# Patient Record
Sex: Male | Born: 1945 | Race: White | Hispanic: No | Marital: Married | Smoking: Never smoker
Health system: Southern US, Community
[De-identification: ages and names within clinical notes are randomized; demographics above are authoritative.]

## PROBLEM LIST (undated history)

## (undated) DIAGNOSIS — I480 Paroxysmal atrial fibrillation: Secondary | ICD-10-CM

## (undated) DIAGNOSIS — Z9289 Personal history of other medical treatment: Secondary | ICD-10-CM

## (undated) DIAGNOSIS — I10 Essential (primary) hypertension: Secondary | ICD-10-CM

## (undated) DIAGNOSIS — I495 Sick sinus syndrome: Secondary | ICD-10-CM

## (undated) HISTORY — PX: KNEE SURGERY: SHX244

## (undated) HISTORY — DX: Essential (primary) hypertension: I10

## (undated) HISTORY — DX: Sick sinus syndrome: I49.5

## (undated) HISTORY — PX: HERNIA REPAIR: SHX51

## (undated) HISTORY — DX: Personal history of other medical treatment: Z92.89

---

## 1998-07-18 ENCOUNTER — Ambulatory Visit (HOSPITAL_COMMUNITY): Admission: RE | Admit: 1998-07-18 | Discharge: 1998-07-18 | Payer: Self-pay | Admitting: Gastroenterology

## 2009-07-11 ENCOUNTER — Emergency Department (HOSPITAL_COMMUNITY): Admission: EM | Admit: 2009-07-11 | Discharge: 2009-07-11 | Payer: Self-pay | Admitting: Emergency Medicine

## 2010-07-11 ENCOUNTER — Ambulatory Visit
Admission: RE | Admit: 2010-07-11 | Discharge: 2010-07-11 | Disposition: A | Discharge status: Home / Self Care | Payer: BC Managed Care – PPO | Source: Ambulatory Visit | Attending: Internal Medicine | Admitting: Internal Medicine

## 2010-07-11 ENCOUNTER — Other Ambulatory Visit: Payer: Self-pay | Admitting: Internal Medicine

## 2010-07-11 DIAGNOSIS — R059 Cough, unspecified: Secondary | ICD-10-CM

## 2010-07-11 DIAGNOSIS — R05 Cough: Secondary | ICD-10-CM

## 2010-07-15 LAB — POCT CARDIAC MARKERS
CKMB, poc: <1 ng/mL — ABNORMAL LOW (ref 1.0–8.0)
Myoglobin, poc: 39.2 ng/mL (ref 12–200)
Myoglobin, poc: 59.2 ng/mL (ref 12–200)

## 2010-07-15 LAB — CBC
HCT: 42.3 % (ref 39.0–52.0)
Hemoglobin: 14.4 g/dL (ref 13.0–17.0)
MCHC: 34.2 g/dL (ref 30.0–36.0)
MCV: 94 fL (ref 78.0–100.0)
Platelets: 143 10*3/uL — ABNORMAL LOW (ref 150–400)

## 2010-07-15 LAB — DIFFERENTIAL
Basophils Absolute: 0 10*3/uL (ref 0.0–0.1)
Lymphs Abs: 2.2 10*3/uL (ref 0.7–4.0)
Monocytes Relative: 7 % (ref 3–12)
Neutrophils Relative %: 62 % (ref 43–77)

## 2010-07-15 LAB — POCT I-STAT, CHEM 8
Calcium, Ion: 1.08 mmol/L — ABNORMAL LOW (ref 1.12–1.32)
Creatinine, Ser: 1 mg/dL (ref 0.4–1.5)
Potassium: 3.5 mEq/L (ref 3.5–5.1)
TCO2: 29 mmol/L (ref 0–100)

## 2010-07-15 LAB — PROTIME-INR
INR: 1.01 (ref 0.00–1.49)
Prothrombin Time: 13.2 seconds (ref 11.6–15.2)

## 2010-12-09 ENCOUNTER — Emergency Department (HOSPITAL_COMMUNITY)
Admission: EM | Admit: 2010-12-09 | Discharge: 2010-12-10 | Disposition: A | Discharge status: Home / Self Care | Payer: BC Managed Care – PPO | Attending: Emergency Medicine | Admitting: Emergency Medicine

## 2010-12-09 DIAGNOSIS — J329 Chronic sinusitis, unspecified: Secondary | ICD-10-CM | POA: Insufficient documentation

## 2010-12-09 DIAGNOSIS — I10 Essential (primary) hypertension: Secondary | ICD-10-CM | POA: Insufficient documentation

## 2010-12-09 DIAGNOSIS — R51 Headache: Secondary | ICD-10-CM | POA: Insufficient documentation

## 2011-05-26 ENCOUNTER — Ambulatory Visit (INDEPENDENT_AMBULATORY_CARE_PROVIDER_SITE_OTHER): Payer: BC Managed Care – PPO | Admitting: Family Medicine

## 2011-05-26 VITALS — BP 146/77 | HR 63 | Temp 97.9°F | Resp 16 | Ht 68.0 in | Wt 203.8 lb

## 2011-05-26 DIAGNOSIS — J019 Acute sinusitis, unspecified: Secondary | ICD-10-CM

## 2011-05-26 DIAGNOSIS — I10 Essential (primary) hypertension: Secondary | ICD-10-CM

## 2011-05-26 MED ORDER — CEFDINIR 300 MG PO CAPS: 300.0000 mg | ORAL_CAPSULE | Freq: Two times a day (BID) | ORAL | Status: AC

## 2011-05-26 NOTE — Progress Notes (Signed)
  Subjective:    Patient ID: Nathan Riley, male    DOB: 01/09/1946, 66 y.o.   MRN: [id number]  HPI Nathan Riley is a 66 y.o. male Head cold x 2-3 weeks, improved past 4-5 days.  But now with L sinus pressure and headache, and L upper tooth pain x 2-3 days.  HA, min relief with tylenol.   Review of Systems  Constitutional: Negative for fever, chills and diaphoresis.  HENT: Positive for nosebleeds, congestion, facial swelling, dental problem, postnasal drip and sinus pressure. Negative for ear pain, rhinorrhea, neck pain and ear discharge.        Swelling l cheek/sinus   Respiratory: Positive for chest tightness. Negative for cough and shortness of breath.   Cardiovascular: Negative for chest pain and palpitations.  Skin: Negative for rash.  Neurological: Positive for dizziness.       Dizziness last week with congestion       Objective:   Physical Exam  Constitutional: He is oriented to person, place, and time. He appears well-developed and well-nourished. No distress.  HENT:  Head: Normocephalic and atraumatic.  Right Ear: Tympanic membrane, external ear and ear canal normal. No middle ear effusion.  Left Ear: Tympanic membrane, external ear and ear canal normal.  No middle ear effusion.  Nose: Right sinus exhibits no maxillary sinus tenderness and no frontal sinus tenderness. Left sinus exhibits maxillary sinus tenderness. Left sinus exhibits no frontal sinus tenderness.  Mouth/Throat: Uvula is midline, oropharynx is clear and moist and mucous membranes are normal. No oral lesions. No dental abscesses or dental caries.  Cardiovascular: Normal rate, regular rhythm, normal heart sounds and intact distal pulses.   Pulmonary/Chest: Effort normal and breath sounds normal. He has no wheezes. He has no rales.  Musculoskeletal: Normal range of motion.  Neurological: He is alert and oriented to person, place, and time.  Skin: Skin is warm and dry. He is not diaphoretic.  Psychiatric: He  has a normal mood and affect.    .      Assessment & Plan:   1. Sinusitis acute   2. HTN (hypertension)     omnicef 300mg  bid saline ns, avoid decongestants with HTN,  rtc precautions

## 2011-05-26 NOTE — Patient Instructions (Signed)
  Return to the clinic or go to the nearest emergency room if any of your symptoms worsen or new symptoms occur.   Sinusitis Sinuses are air pockets within the bones of your face. The growth of bacteria within a sinus leads to infection. The infection prevents the sinuses from draining. This infection is called sinusitis. SYMPTOMS  There will be different areas of pain depending on which sinuses have become infected.  The maxillary sinuses often produce pain beneath the eyes.   Frontal sinusitis may cause pain in the middle of the forehead and above the eyes.  Other problems (symptoms) include:  Toothaches.   Colored, pus-like (purulent) drainage from the nose.   Swelling, warmth, and tenderness over the sinus areas may be signs of infection.  TREATMENT  Sinusitis is most often determined by an exam.X-rays may be taken. If x-rays have been taken, make sure you obtain your results or find out how you are to obtain them. Your caregiver may give you medications (antibiotics). These are medications that will help kill the bacteria causing the infection. You may also be given a medication (decongestant) that helps to reduce sinus swelling.  HOME CARE INSTRUCTIONS   Only take over-the-counter or prescription medicines for pain, discomfort, or fever as directed by your caregiver.   Drink extra fluids. Fluids help thin the mucus so your sinuses can drain more easily.   Applying either moist heat or ice packs to the sinus areas may help relieve discomfort.  Use saline nasal sprays to help moisten your sinuses. The sprays can be found at your local drugstore.  SEEK IMMEDIATE MEDICAL CARE IF:  You have a fever.   You have increasing pain, severe headaches, or toothache.   You have nausea, vomiting, or drowsiness.   You develop unusual swelling around the face or trouble seeing.  MAKE SURE YOU:   Understand these instructions.   Will watch your condition.   Will get help right away if  you are not doing well or get worse.  Document Released: 04/07/2005 Document Revised: 12/18/2010 Document Reviewed: 11/04/2006 [facility] Patient Information 2012 [place], [place].

## 2011-07-18 IMAGING — CT CT HEAD W/O CM
1 of 2 series · 13 of 30 positions shown, 17 images · non-contrast
Comparison: None.

CLINICAL DATA: Headaches with lightheadedness and nausea.

CT HEAD WITHOUT CONTRAST
TECHNIQUE: Contiguous axial images were obtained from the base of
the skull through the vertex without contrast.

[Series 2: brain · axial · 0.47mm/px · z∈[+153,+290]mm · 13 of 32 slices shown, 17 images]
[im 3/32  brain]
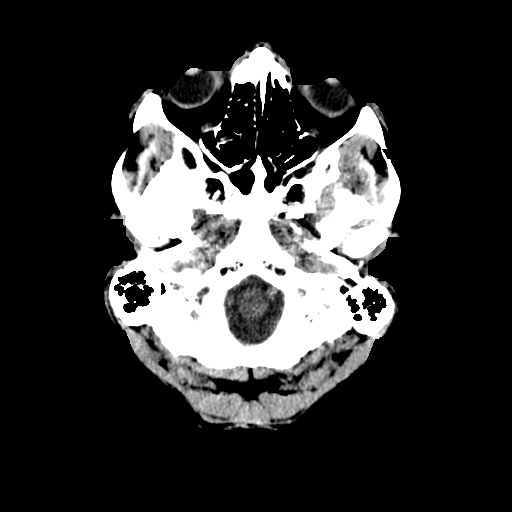
[im 3/32  bone]
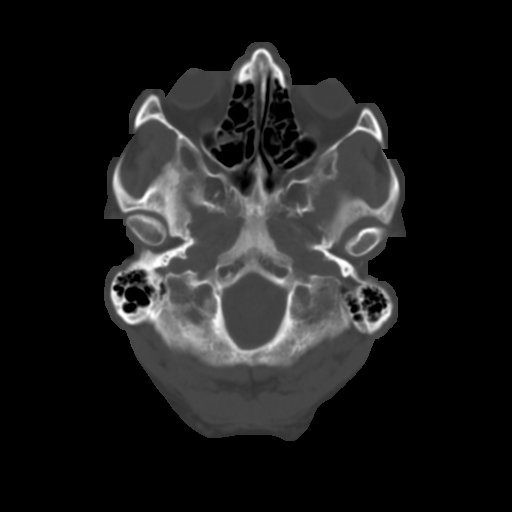
[im 5/32  brain]
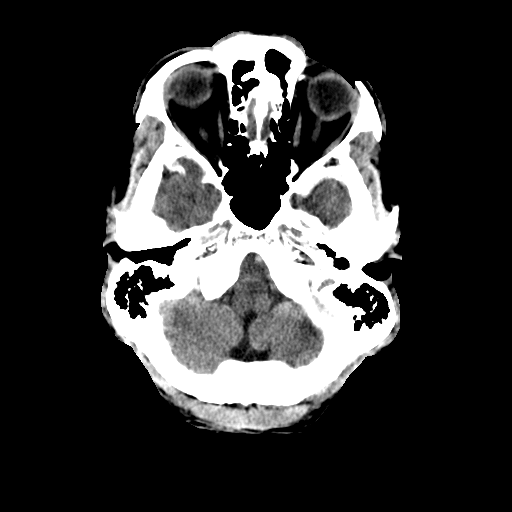
[im 7/32  brain]
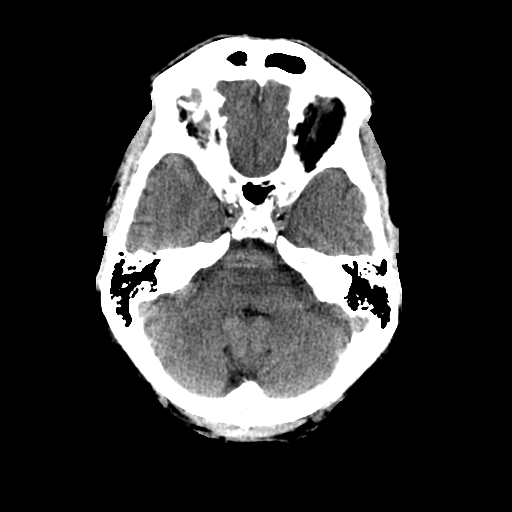
[im 9/32  brain]
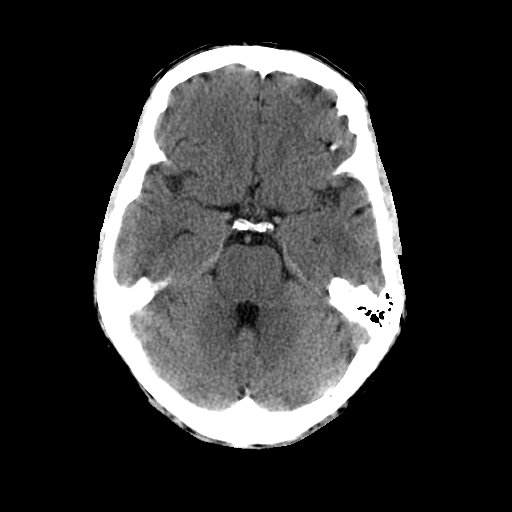
[im 12/32  brain]
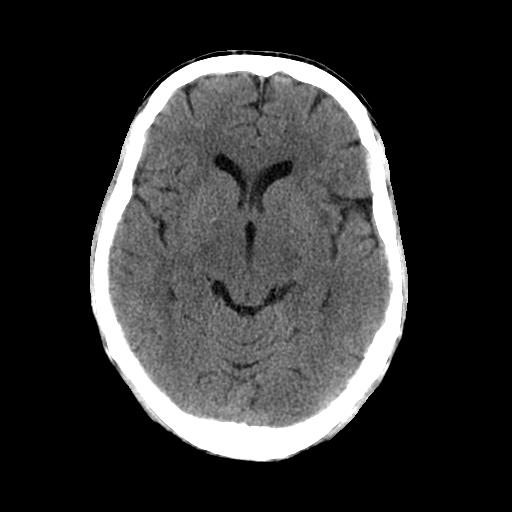
[im 12/32  bone]
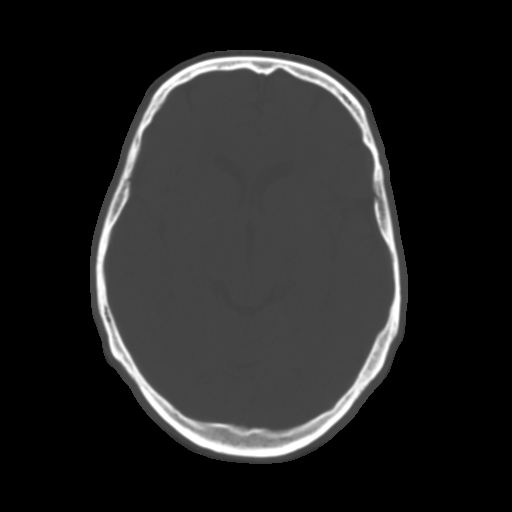
[im 14/32  brain]
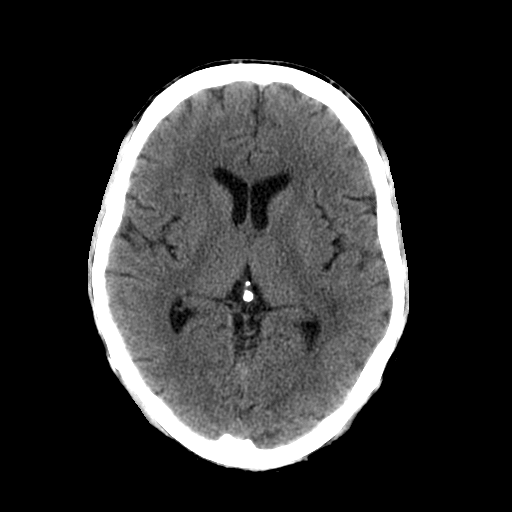
[im 16/32  brain]
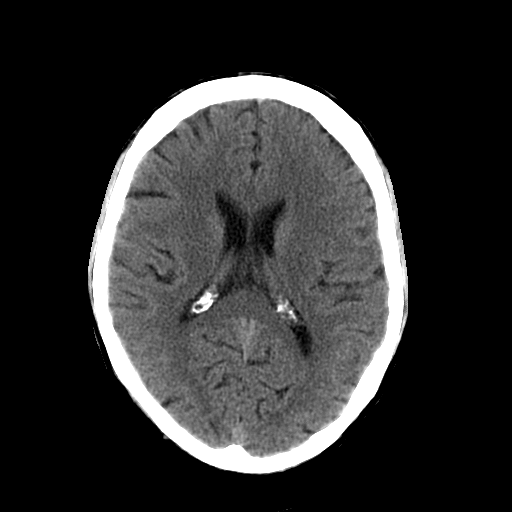
[im 18/32  brain]
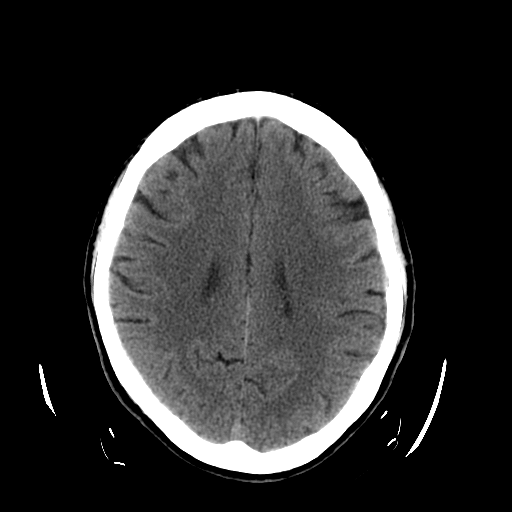
[im 20/32  brain]
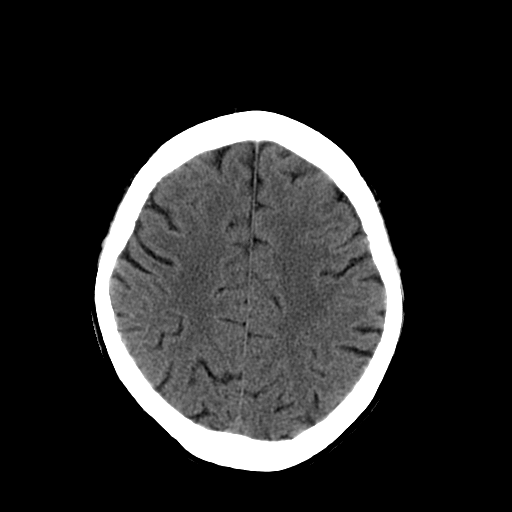
[im 20/32  bone]
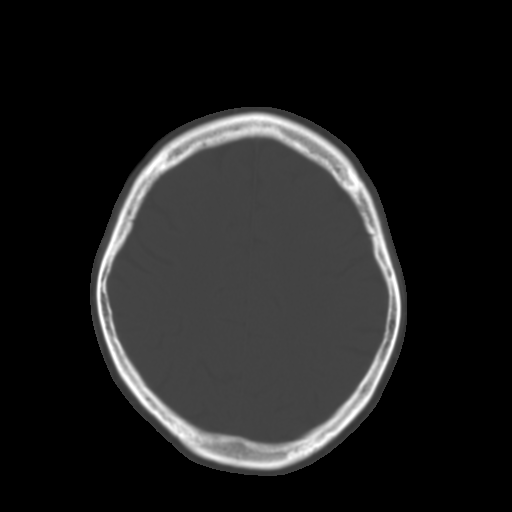
[im 23/32  brain]
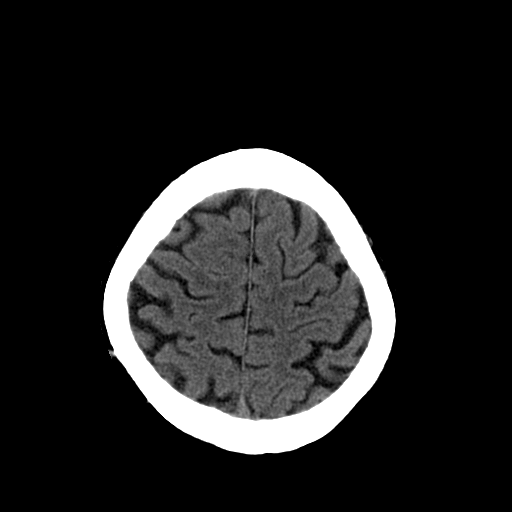
[im 25/32  brain]
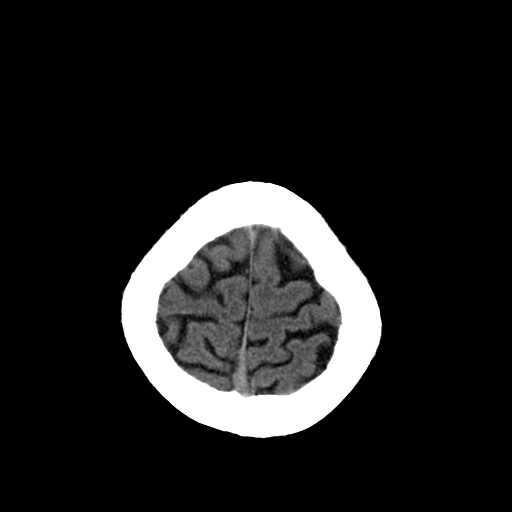
[im 27/32  brain]
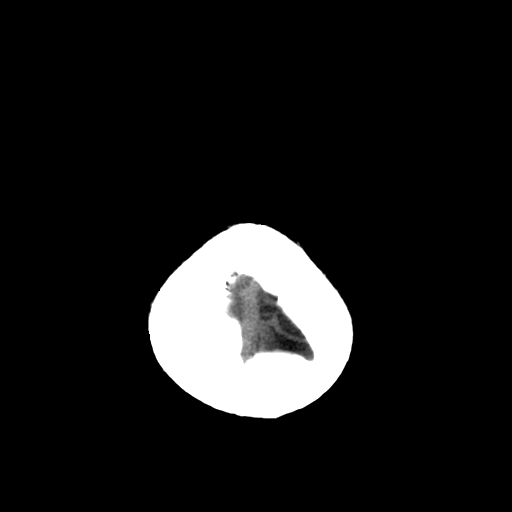
[im 29/32  brain]
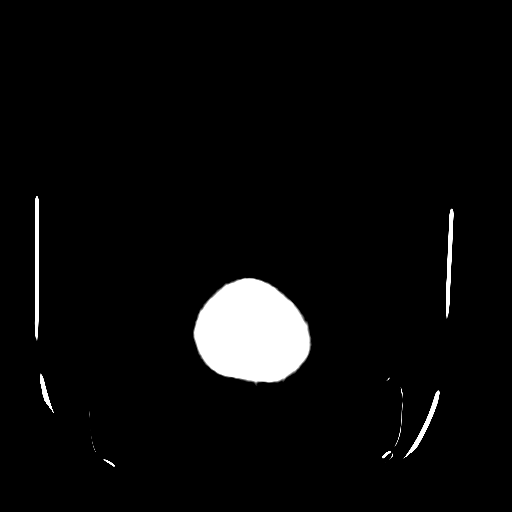
[im 29/32  bone]
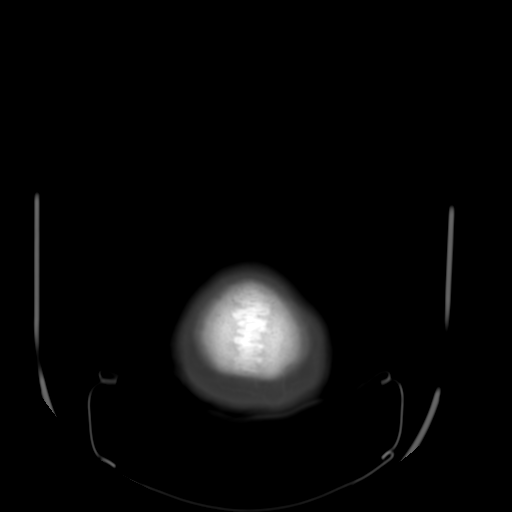

[13 of 30 positions shown; findings below may reference images not displayed]

FINDINGS: There is no evidence of acute intracranial hemorrhage,
mass lesion, brain edema or extra-axial fluid collection.  The
ventricles and subarachnoid spaces are appropriately sized for age.
There is no CT evidence of acute cortical infarction.

The visualized paranasal sinuses are clear. The calvarium is
intact.
IMPRESSION: Unremarkable noncontrast head CT for age.

## 2011-07-18 IMAGING — CR DG CHEST 2V
2 series · 2 of 2 positions shown · non-contrast
Comparison: None.

CLINICAL DATA: Dizziness, diaphoresis and indigestion.

CHEST - 2 VIEW

[w chest pa]
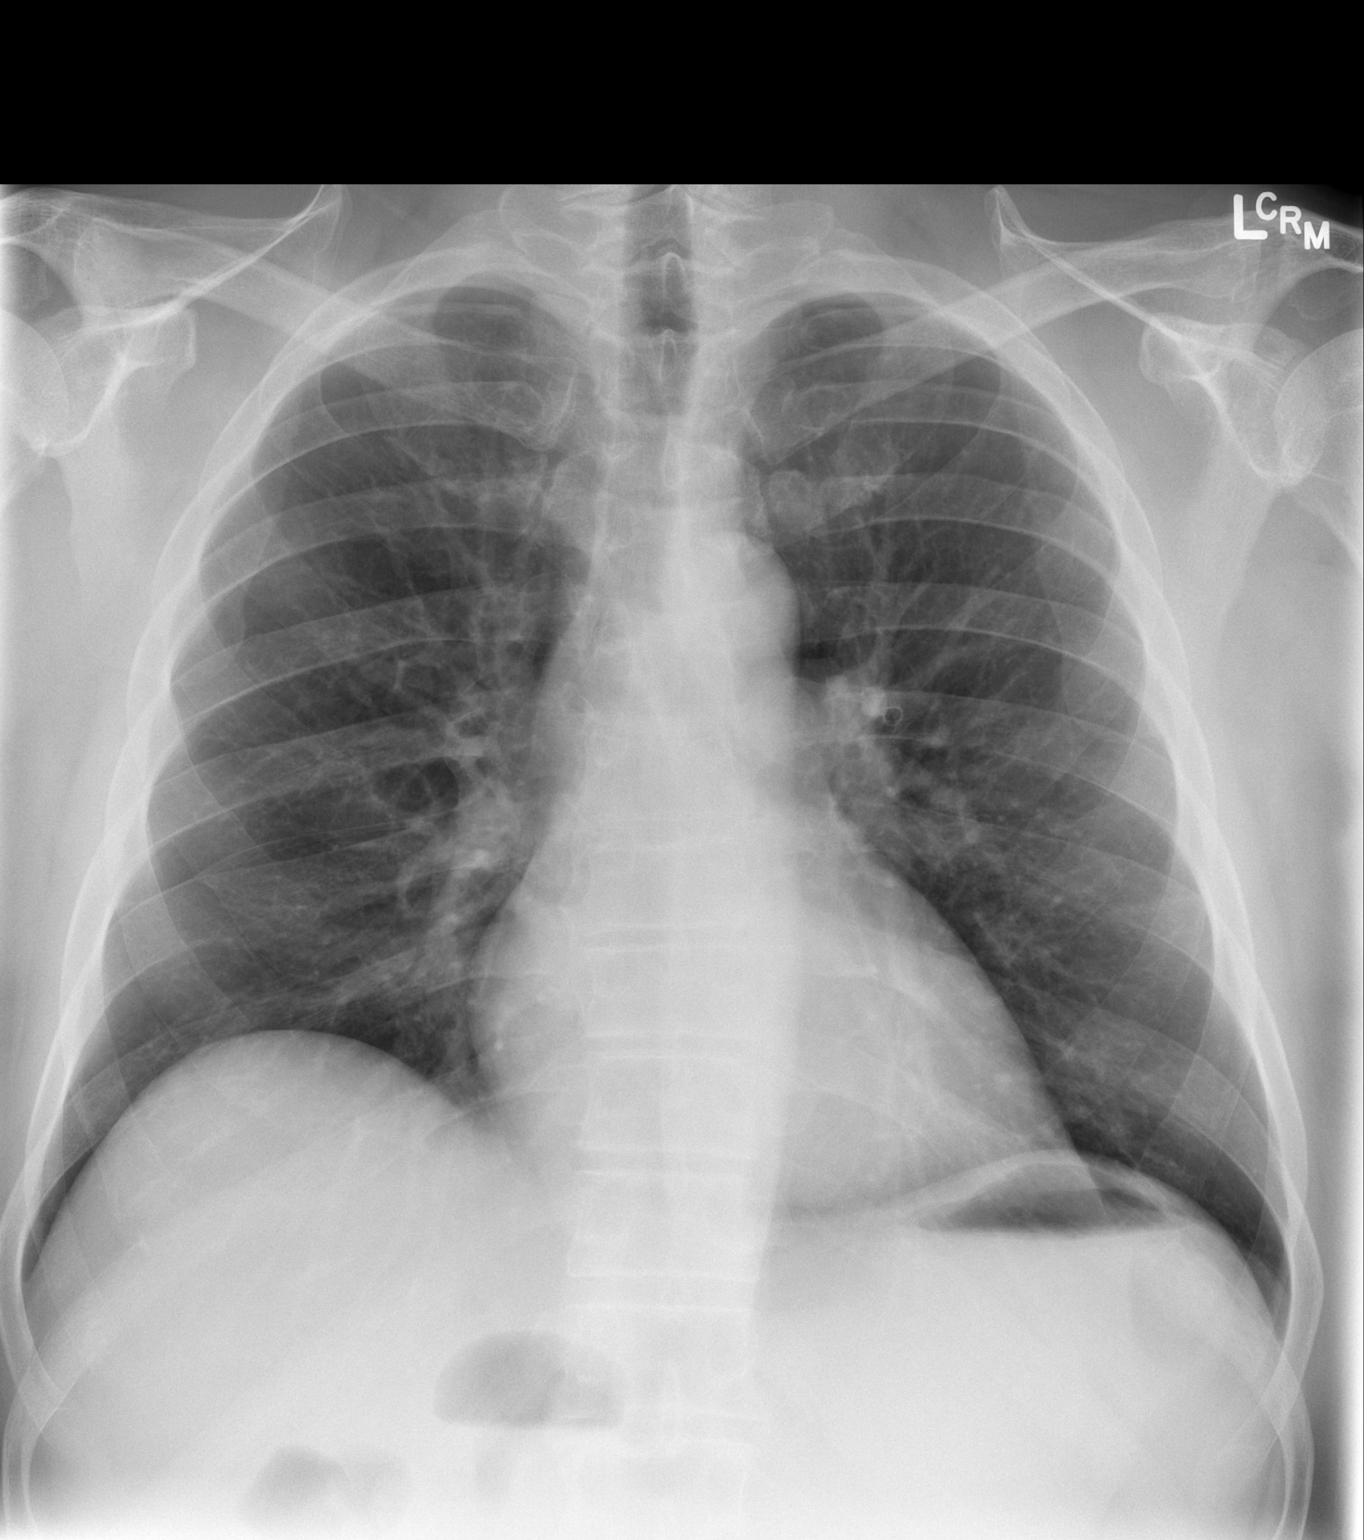

[w chest lat]
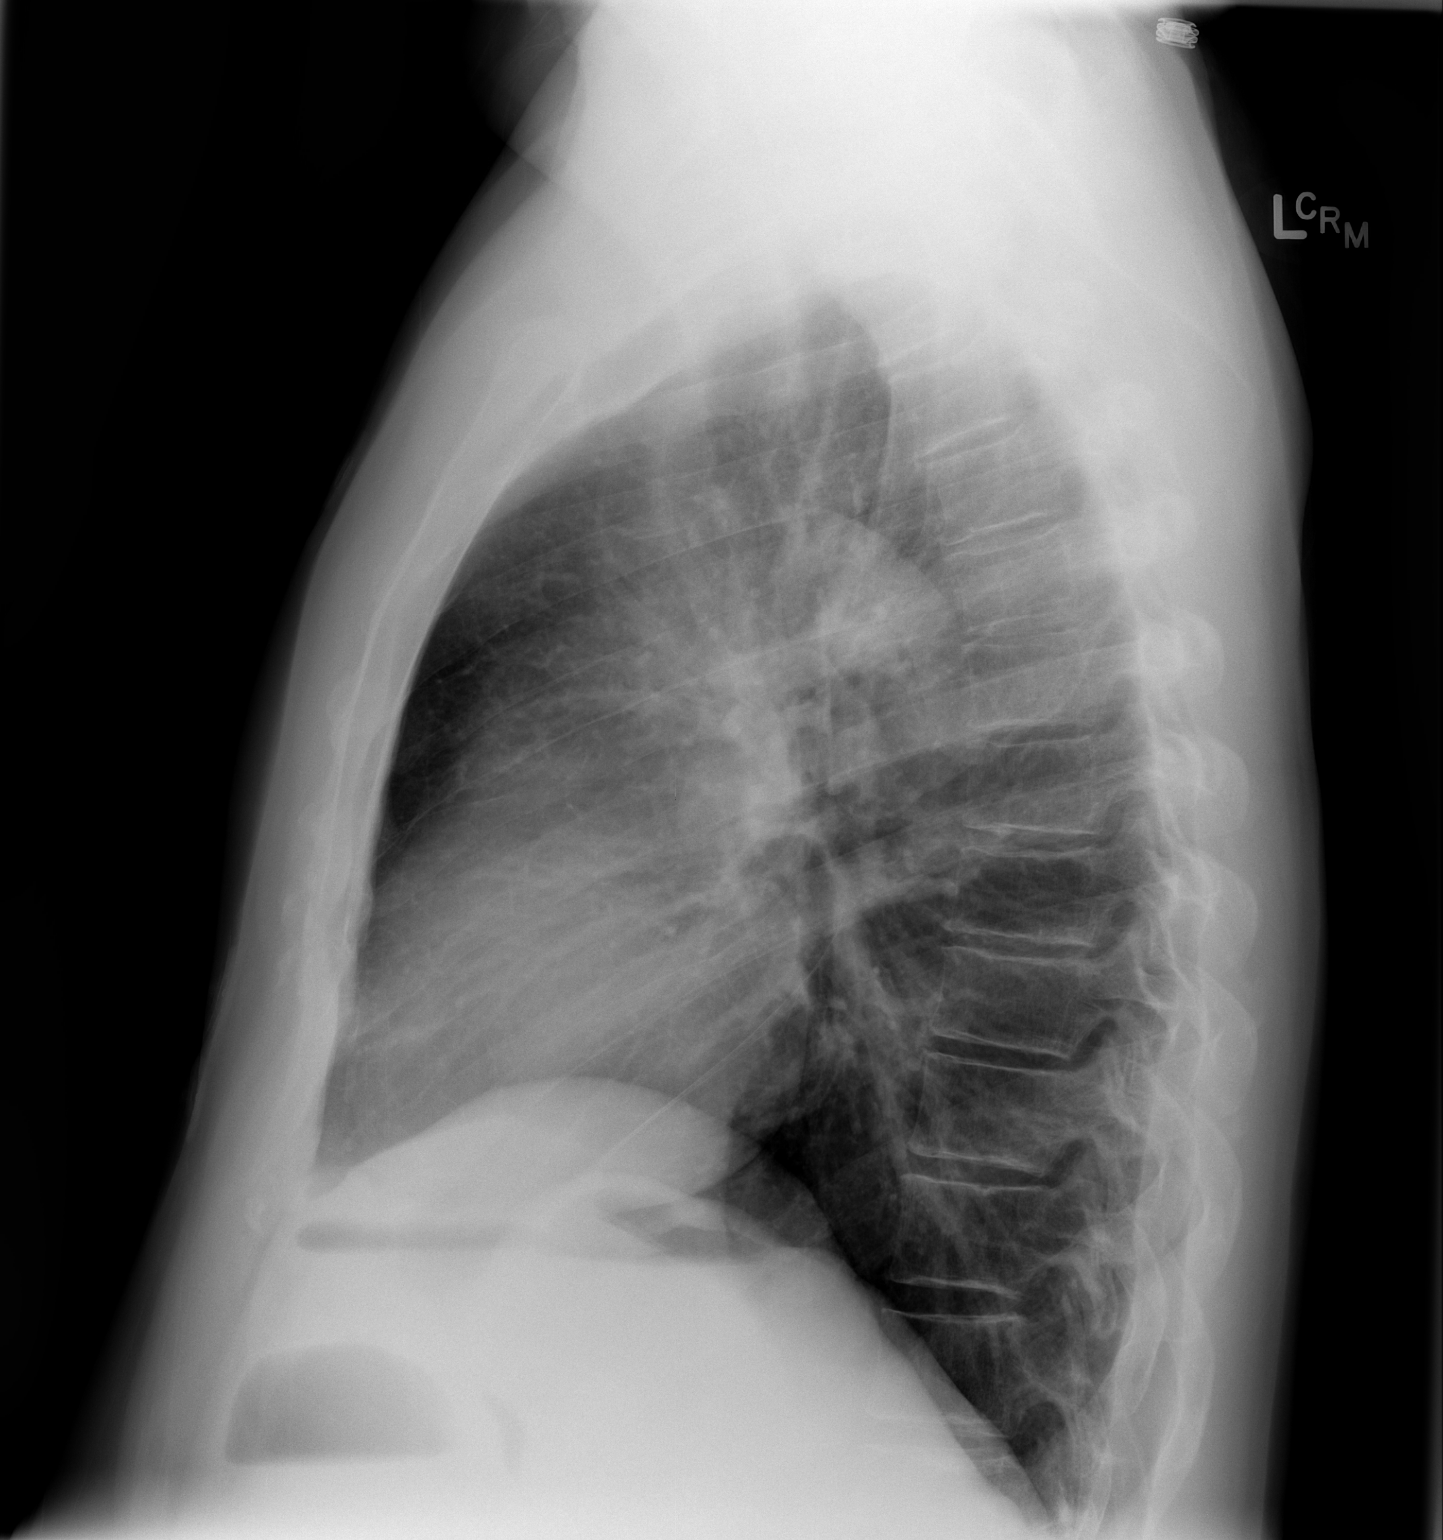

[2 of 2 positions shown; findings below may reference images not displayed]

FINDINGS: The heart size and mediastinal contours are normal.  The
lungs are clear.  There is no pleural effusion or pneumothorax.
The osseous structures appear normal.
IMPRESSION: No active cardiopulmonary process.

## 2012-01-06 ENCOUNTER — Other Ambulatory Visit: Payer: Self-pay | Admitting: Gastroenterology

## 2013-06-08 DIAGNOSIS — H2510 Age-related nuclear cataract, unspecified eye: Secondary | ICD-10-CM | POA: Insufficient documentation

## 2013-06-08 DIAGNOSIS — H3530 Unspecified macular degeneration: Secondary | ICD-10-CM | POA: Insufficient documentation

## 2013-06-08 DIAGNOSIS — H01009 Unspecified blepharitis unspecified eye, unspecified eyelid: Secondary | ICD-10-CM | POA: Insufficient documentation

## 2013-07-13 DIAGNOSIS — Z961 Presence of intraocular lens: Secondary | ICD-10-CM | POA: Insufficient documentation

## 2013-08-01 ENCOUNTER — Encounter: Payer: Self-pay | Admitting: *Deleted

## 2013-08-04 DIAGNOSIS — H35359 Cystoid macular degeneration, unspecified eye: Secondary | ICD-10-CM | POA: Insufficient documentation

## 2013-08-04 DIAGNOSIS — H35719 Central serous chorioretinopathy, unspecified eye: Secondary | ICD-10-CM | POA: Insufficient documentation

## 2013-08-30 ENCOUNTER — Other Ambulatory Visit: Payer: Self-pay | Admitting: Dermatology

## 2013-09-08 DIAGNOSIS — IMO0002 Reserved for concepts with insufficient information to code with codable children: Secondary | ICD-10-CM | POA: Insufficient documentation

## 2014-03-28 ENCOUNTER — Telehealth: Payer: Self-pay | Admitting: Interventional Cardiology

## 2014-03-28 NOTE — Telephone Encounter (Signed)
F/u    Pt's wife waiting on call back from nurse to see if pt can be seen today. Please call.

## 2014-03-28 NOTE — Telephone Encounter (Signed)
New message        Pt would like to see dr Katrinka Blazingsmith today / pt pulse was 138 , bp was 118

## 2014-03-28 NOTE — Telephone Encounter (Signed)
Returned pt wife call.she reports that pt heartrate this morning at the dentist was 138 bpm.pt had a near syncope episode last week. Pt pcp recommended pt f/ u with pcp.appt scheduled with Dr.Smith on 12/10 @ 4:15pm.pt is currently asymptomatic. Pt wife adv to call back if symptoms develop.pt wife agreeable and verbalized understanding.

## 2014-03-30 ENCOUNTER — Ambulatory Visit (INDEPENDENT_AMBULATORY_CARE_PROVIDER_SITE_OTHER): Payer: Medicare HMO | Admitting: Interventional Cardiology

## 2014-03-30 ENCOUNTER — Encounter: Payer: Self-pay | Admitting: Interventional Cardiology

## 2014-03-30 VITALS — BP 144/72 | HR 51 | Ht 68.0 in | Wt 198.8 lb

## 2014-03-30 DIAGNOSIS — R55 Syncope and collapse: Secondary | ICD-10-CM

## 2014-03-30 DIAGNOSIS — I10 Essential (primary) hypertension: Secondary | ICD-10-CM

## 2014-03-30 DIAGNOSIS — R002 Palpitations: Secondary | ICD-10-CM

## 2014-03-30 DIAGNOSIS — I493 Ventricular premature depolarization: Secondary | ICD-10-CM | POA: Insufficient documentation

## 2014-03-30 NOTE — Progress Notes (Signed)
Patient ID: Nathan Riley Impastato, male   DOB: 04-Jul-1945, 68 y.o.   MRN: [id number]    [address] [place], [place]  [id number] Phone: [phone] Fax:  [phone]  Date:  03/30/2014   ID:  Nathan Riley Edmundson, DOB 04-Jul-1945, MRN [id number]  PCP:  No primary care provider on file.   ASSESSMENT:  1. Near syncope with associated palpitations 2. History of PVCs 3. Essential hypertension  PLAN:  1. Thirty-day continuous monitor 2. If prolonged palpitations come in for evaluation but do not drive.   SUBJECTIVE: Nathan Riley Trinka is a 68 y.o. male who is concerned about 2 or 3 episodes of near syncope. One episode occurred while driving in the presence of his wife. He states that he had palpitations and felt as though he maced gray out. It quickly resolved after no longer than 3-4 seconds. He has had other similar episodes over the past month. He also knows that oh one occasion he had a very rapid heartbeat detected by his blood pressure monitor that suggested a heart rate greater than 138 bpm. Otherwise he is doing well. No physical limitations. Denies dyspnea on exertion. He has not had chest pain. No orthopnea or PND. No transient neurological complaints.   Wt Readings from Last 3 Encounters:  03/30/14 198 lb 12.8 oz (90.175 kg)  05/26/11 203 lb 12.8 oz (92.443 kg)     Past Medical History  Diagnosis Date  . Essential hypertension, benign   . Palpitations   . Other premature beats     Current Outpatient Prescriptions  Medication Sig Dispense Refill  . Cholecalciferol (PA VITAMIN D-3) 2000 UNITS CAPS daily.    . fish oil-omega-3 fatty acids 1000 MG capsule Take 2 g by mouth daily.    . Melatonin 5 MG CAPS Take 1 capsule by mouth at bedtime.    . metoprolol tartrate (LOPRESSOR) 25 MG tablet Take 12.5 mg by mouth 2 (two) times daily.   3   No current facility-administered medications for this visit.    Allergies:   No Known Allergies  Social History:  The patient   reports that he has never smoked. He does not have any smokeless tobacco history on file.   ROS:  Please see the history of present illness.   No wheezing, double vision, nocturia, polyuria, headache, or edema.   All other systems reviewed and negative.   OBJECTIVE: VS:  BP 144/72 mmHg  Pulse 51  Ht 5\' 8"  (1.727 m)  Wt 198 lb 12.8 oz (90.175 kg)  BMI 30.23 kg/m2 Well nourished, well developed, in no acute distress, healthy appearing HEENT: normal Neck: JVD flat. Carotid bruit absent  Cardiac:  normal S1, S2; RRR; no murmur Lungs:  clear to auscultation bilaterally, no wheezing, rhonchi or rales Abd: soft, nontender, no hepatomegaly Ext: Edema absent. Pulses 2+ Skin: warm and dry Neuro:  CNs 2-12 intact, no focal abnormalities noted  EKG:  Sinus bradycardia at 51 bpm with small inferior Q waves       Signed, Darci NeedleHenry W. Riley. Janal Haak III, MD 03/30/2014 5:37 PM

## 2014-03-30 NOTE — Patient Instructions (Signed)
Your physician recommends that you continue on your current medications as directed. Please refer to the Current Medication list given to you today.  Your physician has recommended that you wear an event monitor. Event monitors are medical devices that record the heart's electrical activity. Doctors most often us these monitors to diagnose arrhythmias. Arrhythmias are problems with the speed or rhythm of the heartbeat. The monitor is a small, portable device. You can wear one while you do your normal daily activities. This is usually used to diagnose what is causing palpitations/syncope (passing out).  Your physician recommends that you schedule a follow-up appointment pending results

## 2014-03-31 ENCOUNTER — Encounter: Payer: Self-pay | Admitting: Radiology

## 2014-03-31 ENCOUNTER — Encounter (INDEPENDENT_AMBULATORY_CARE_PROVIDER_SITE_OTHER): Payer: Medicare HMO

## 2014-03-31 DIAGNOSIS — R55 Syncope and collapse: Secondary | ICD-10-CM

## 2014-03-31 DIAGNOSIS — R002 Palpitations: Secondary | ICD-10-CM

## 2014-03-31 NOTE — Progress Notes (Signed)
[employer] 30 day monitor applied. EOS 05-01-14

## 2014-04-02 ENCOUNTER — Emergency Department (HOSPITAL_COMMUNITY): Payer: Medicare HMO

## 2014-04-02 ENCOUNTER — Encounter (HOSPITAL_COMMUNITY): Payer: Self-pay | Admitting: Emergency Medicine

## 2014-04-02 ENCOUNTER — Inpatient Hospital Stay (HOSPITAL_COMMUNITY)
Admission: EM | Admit: 2014-04-02 | Discharge: 2014-04-04 | DRG: 244 | Disposition: A | Discharge status: Home / Self Care | Payer: Medicare HMO | Attending: Internal Medicine | Admitting: Internal Medicine

## 2014-04-02 DIAGNOSIS — I480 Paroxysmal atrial fibrillation: Secondary | ICD-10-CM | POA: Diagnosis present

## 2014-04-02 DIAGNOSIS — R001 Bradycardia, unspecified: Secondary | ICD-10-CM | POA: Diagnosis not present

## 2014-04-02 DIAGNOSIS — Z79899 Other long term (current) drug therapy: Secondary | ICD-10-CM

## 2014-04-02 DIAGNOSIS — I4891 Unspecified atrial fibrillation: Secondary | ICD-10-CM

## 2014-04-02 DIAGNOSIS — I10 Essential (primary) hypertension: Secondary | ICD-10-CM | POA: Diagnosis present

## 2014-04-02 DIAGNOSIS — R55 Syncope and collapse: Secondary | ICD-10-CM | POA: Diagnosis present

## 2014-04-02 DIAGNOSIS — I495 Sick sinus syndrome: Secondary | ICD-10-CM | POA: Diagnosis not present

## 2014-04-02 DIAGNOSIS — I455 Other specified heart block: Secondary | ICD-10-CM | POA: Diagnosis present

## 2014-04-02 DIAGNOSIS — Z950 Presence of cardiac pacemaker: Secondary | ICD-10-CM

## 2014-04-02 HISTORY — DX: Paroxysmal atrial fibrillation: I48.0

## 2014-04-02 LAB — CBC WITH DIFFERENTIAL/PLATELET
BASOS ABS: 0 10*3/uL (ref 0.0–0.1)
BASOS PCT: 0 % (ref 0–1)
Eosinophils Absolute: 0.1 10*3/uL (ref 0.0–0.7)
Eosinophils Relative: 2 % (ref 0–5)
HCT: 42.3 % (ref 39.0–52.0)
Hemoglobin: 14.7 g/dL (ref 13.0–17.0)
Lymphocytes Relative: 41 % (ref 12–46)
Lymphs Abs: 2.5 10*3/uL (ref 0.7–4.0)
MCH: 31.7 pg (ref 26.0–34.0)
MCHC: 34.8 g/dL (ref 30.0–36.0)
MCV: 91.2 fL (ref 78.0–100.0)
Monocytes Absolute: 0.5 10*3/uL (ref 0.1–1.0)
Monocytes Relative: 8 % (ref 3–12)
NEUTROS ABS: 3 10*3/uL (ref 1.7–7.7)
NEUTROS PCT: 49 % (ref 43–77)
PLATELETS: 136 10*3/uL — AB (ref 150–400)
RBC: 4.64 MIL/uL (ref 4.22–5.81)
RDW: 12.5 % (ref 11.5–15.5)
WBC: 6.1 10*3/uL (ref 4.0–10.5)

## 2014-04-02 LAB — I-STAT TROPONIN, ED: TROPONIN I, POC: 0.07 ng/mL (ref 0.00–0.08)

## 2014-04-02 LAB — I-STAT CHEM 8, ED
BUN: 25 mg/dL — ABNORMAL HIGH (ref 6–23)
CALCIUM ION: 1.17 mmol/L (ref 1.13–1.30)
Chloride: 108 mEq/L (ref 96–112)
Creatinine, Ser: 1.2 mg/dL (ref 0.50–1.35)
Glucose, Bld: 88 mg/dL (ref 70–99)
HEMATOCRIT: 44 % (ref 39.0–52.0)
HEMOGLOBIN: 15 g/dL (ref 13.0–17.0)
Potassium: 4 mEq/L (ref 3.7–5.3)
SODIUM: 142 meq/L (ref 137–147)
TCO2: 23 mmol/L (ref 0–100)

## 2014-04-02 NOTE — ED Notes (Addendum)
HR 143 Afib; 2232 Cardiology at bedside. Pt had three pauses lasting around 8 seconds.  During these pauses pt felt dizzy.  Brief ST, SR then back in Afib.

## 2014-04-02 NOTE — H&P (Signed)
History and Physical   Admit date: 04/02/2014 Name:  Nathan Riley NurseBilly B Manske Medical record number: [id number] DOB/Age:  [birth year]  68 y.o. male  [facility]   [facility]  Primary Cardiologist:  Dr. Verdis PrimeHenry Smith  Primary Physician:   Dr. Andi DevonKimberly Shelton  Chief complaint/reason for admission: Dizziness  HPI:  This very nice 68 year old male has previously been in good health except for mild hypertension treated with a beta blocker.  He recently began to have some episodes of presyncope and near syncope associated with some palpitations and occasional episodes of irregular heart rate.  He had a 30 day monitor applied on December 11.  He drank a couple of beers today and went out dancing with his wife tonight.  He then began to have some pauses and near syncope as well as some rapid heartbeat.  I was called by the monitoring service.  He was unable to reach the patient and described several pauses, some being as long as 8 or 9 seconds.  In the meantime, the patient came to the emergency room where he was found to be in rapid atrial fibrillation.  While examining the patient, he was noted to have termination of atrial fibrillation with posttermination pauses of between 8 and 9 seconds, associated with lightheadedness and dizziness.  He would then resume sinus rhythm.  He is currently in sinus rhythm as we speak.  He took a couple of metoprolol earlier this evening for a total of 25 mg dose.  He doesn't have any angina but does have some history of coronary disease in the family.  He does not have a prior echo.   Past Medical History  Diagnosis Date  . Essential hypertension, benign   . Paroxysmal atrial fibrillation   . Sick sinus syndrome     Past Surgical History  Procedure Laterality Date  . Hernia repair    . Knee surgery     Allergies: has No Known Allergies.   Medications: Prior to Admission medications   Medication Sig Start Date End Date Taking? Authorizing Provider   Cholecalciferol (PA VITAMIN D-3) 2000 UNITS CAPS Take 2,000 Units by mouth daily. daily.   Yes Historical Provider, MD  fish oil-omega-3 fatty acids 1000 MG capsule Take 2 g by mouth 2 (two) times daily.    Yes Historical Provider, MD  metoprolol tartrate (LOPRESSOR) 25 MG tablet Take 12.5 mg by mouth 2 (two) times daily.  02/20/14  Yes Historical Provider, MD  Melatonin 5 MG CAPS Take 1 capsule by mouth at bedtime.    Historical Provider, MD   Family History:  Family Status  Relation Status Death Age  . Father Deceased 664    GI bleed  . Mother Deceased 4593    old age  . Brother Deceased 6383    alzheimer's  . Brother Deceased 8865    died of premature CAD  . Brother Alive     CAD  . Brother Alive   . Sister Deceased 6460's    leukemia  . Sister Deceased 2770's    dementia  . Sister Alive     smoker, poor health  . Sister Alive     COPD    Social History:   reports that he has never smoked. He has never used smokeless tobacco. He reports that he drinks alcohol. He reports that he does not use illicit drugs.   History   Social History Narrative   Ambulance personAppliance repair  Review of Systems: He has been treated for central serous retinopathy as well [facility]. Other than as noted above, the remainder of the review of systems is normal  Physical Exam: BP 149/76 mmHg  Pulse 72  Temp(Src) 97.7 F (36.5 C) (Oral)  Resp 15  SpO2 96% General appearance: Pleasant male currently in no acute distress Head: Normocephalic, without obvious abnormality, atraumatic Eyes: conjunctivae/corneas clear. PERRL, EOM's intact. Fundi not examined Neck: no adenopathy, no carotid bruit, no JVD and supple, symmetrical, trachea midline Lungs: clear to auscultation bilaterally Heart: Initially regular rate and rhythm without murmur, later examination showed him to be in sinus rhythm with a normal exam. Abdomen: soft, non-tender; bowel sounds normal;  no masses,  no organomegaly Rectal: deferred Extremities: extremities normal, atraumatic, no cyanosis or edema Pulses: 2+ and symmetric Neurologic: Grossly normal   Labs: CBC  Recent Labs  04/02/14 2240 04/02/14 2248  WBC 6.1  --   RBC 4.64  --   HGB 14.7 15.0  HCT 42.3 44.0  PLT 136*  --   MCV 91.2  --   MCH 31.7  --   MCHC 34.8  --   RDW 12.5  --   LYMPHSABS 2.5  --   MONOABS 0.5  --   EOSABS 0.1  --   BASOSABS 0.0  --    CMP   Recent Labs  04/02/14 2248  NA 142  K 4.0  CL 108  GLUCOSE 88  BUN 25*  CREATININE 1.20   EKG: Atrial fibrillation with rapid ventricular response, also observed several posttermination pauses of at least 6-8 seconds in the telemetry while I was examining the patient   Radiology: No active disease   IMPRESSIONS: 1.  Sick sinus syndrome with sinus arrest with posttermination pauses of at least 8-9 seconds.  That has been documented. 2.  Paroxysmal atrial fibrillation with posttermination pauses that are symptomatic CHADS2VASC score is 2 3.  Hypertension  PLAN: We will place external pads to the patient.  Discussed with electrophysiology.  Probably will require pacemaking, so the atrial fibrillation can be addressed.  Begin systemic anticoagulation after pacemaker.  Signed: Darden PalmerW. Spencer Tilley, Jr. MD [facility]  04/02/2014, 11:06 PM

## 2014-04-02 NOTE — ED Provider Notes (Signed)
CSN: [id number]     Arrival date & time 04/02/14  2211 History   First MD Initiated Contact with Patient 04/02/14 2222     Chief Complaint  Patient presents with  . Palpitations  . Near Syncope     (Consider location/radiation/quality/duration/timing/severity/associated sxs/prior Treatment) HPI  Nathan Riley is a 68 y.o. male presents for evaluation of palpitation, and dizziness.  He was at home today, when his cardiac monitor, apparently called 911.  EMS called his home, and suggested that he come to the emergency department.  He came by private vehicle.  He has had intermittent periods of dizziness, with near syncope for a couple of weeks.  He had an external cardiac monitor, placed, 2 days ago.  According to Dr. Donnie Ahoilley, the event monitor showed 8 and 9 second pauses.  The patient denies recent illnesses.  He is taking his usual medications, as prescribed.  There are no other known modifying factors.   Past Medical History  Diagnosis Date  . Essential hypertension, benign   . Palpitations   . Other premature beats    History reviewed. No pertinent past surgical history. No family history on file. History  Substance Use Topics  . Smoking status: Never Smoker   . Smokeless tobacco: Not on file  . Alcohol Use: Yes    Review of Systems  All other systems reviewed and are negative.     Allergies  Review of patient's allergies indicates no known allergies.  Home Medications   Prior to Admission medications   Medication Sig Start Date End Date Taking? Authorizing Provider  Cholecalciferol (PA VITAMIN D-3) 2000 UNITS CAPS daily.    Historical Provider, MD  fish oil-omega-3 fatty acids 1000 MG capsule Take 2 g by mouth daily.    Historical Provider, MD  Melatonin 5 MG CAPS Take 1 capsule by mouth at bedtime.    Historical Provider, MD  metoprolol tartrate (LOPRESSOR) 25 MG tablet Take 12.5 mg by mouth 2 (two) times daily.  02/20/14   Historical Provider, MD   BP 149/81  mmHg  Temp(Src) 97.7 F (36.5 C) (Oral)  Resp 24  SpO2 96% Physical Exam  Constitutional: He is oriented to person, place, and time. He appears well-developed.  Elderly, frail  HENT:  Head: Normocephalic and atraumatic.  Right Ear: External ear normal.  Left Ear: External ear normal.  Eyes: Conjunctivae and EOM are normal. Pupils are equal, round, and reactive to light.  Neck: Normal range of motion and phonation normal. Neck supple.  Cardiovascular: Normal rate and normal heart sounds.   Irregular rhythm  Pulmonary/Chest: Effort normal and breath sounds normal. He exhibits no bony tenderness.  Abdominal: Soft. There is no tenderness.  Musculoskeletal: Normal range of motion.  Neurological: He is alert and oriented to person, place, and time. No cranial nerve deficit or sensory deficit. He exhibits normal muscle tone. Coordination normal.  Skin: Skin is warm, dry and intact.  Psychiatric: He has a normal mood and affect. His behavior is normal. Judgment and thought content normal.  Nursing note and vitals reviewed.   ED Course  Procedures (including critical care time) Medications - No data to display  Patient Vitals for the past 24 hrs:  BP Temp Temp src Resp SpO2  04/02/14 2225 149/81 mmHg 97.7 F (36.5 C) Oral 24 96 %    22:30- discussed with Dr. Donnie Ahoilley, he will see and admit the patient    Labs Review Labs Reviewed  CBC WITH DIFFERENTIAL  I-STAT CHEM  8, ED  I-STAT TROPOININ, ED    Imaging Review No results found.   EKG Interpretation   Date/Time:  Sunday April 02 2014 22:16:42 EST Ventricular Rate:  127 PR Interval:    QRS Duration: 90 QT Interval:  334 QTC Calculation: 485 R Axis:   65 Text Interpretation:  Atrial fibrillation with rapid ventricular response  with premature ventricular or aberrantly conducted complexes Nonspecific  ST abnormality Abnormal ECG Since last tracing Atrial fibrillation is new,  and rate is faster Confirmed by Effie ShyWENTZ   MD, Michela Herst ([id number]) on 04/02/2014  10:23:28 PM      MDM   Final diagnoses:  Atrial fibrillation, unspecified    Apparent new atrial fibrillation, with cardiac pauses.  He will require admission for further observation and treatment by cardiology.  Nursing Notes Reviewed/ Care Coordinated Applicable Imaging Reviewed Interpretation of Laboratory Data incorporated into ED treatment  Plan: Admit  Flint MelterElliott L Ziad Maye, MD 04/02/14 2237

## 2014-04-02 NOTE — ED Notes (Addendum)
Pt. reports palpitations / fast heart rate and near syncope this evening , denies chest pain / no SOB , drank ETOH this evening . Pt. is wearing a portable heart monitor.

## 2014-04-03 ENCOUNTER — Encounter (HOSPITAL_COMMUNITY)
Admission: EM | Disposition: A | Discharge status: Home / Self Care | Payer: Self-pay | Source: Home / Self Care | Attending: Internal Medicine

## 2014-04-03 ENCOUNTER — Telehealth: Payer: Self-pay | Admitting: Interventional Cardiology

## 2014-04-03 ENCOUNTER — Telehealth: Payer: Self-pay

## 2014-04-03 DIAGNOSIS — I517 Cardiomegaly: Secondary | ICD-10-CM

## 2014-04-03 DIAGNOSIS — I480 Paroxysmal atrial fibrillation: Secondary | ICD-10-CM

## 2014-04-03 DIAGNOSIS — I495 Sick sinus syndrome: Secondary | ICD-10-CM

## 2014-04-03 DIAGNOSIS — I455 Other specified heart block: Secondary | ICD-10-CM

## 2014-04-03 HISTORY — PX: PERMANENT PACEMAKER INSERTION: SHX5480

## 2014-04-03 LAB — BASIC METABOLIC PANEL
Anion gap: 12 (ref 5–15)
BUN: 20 mg/dL (ref 6–23)
CALCIUM: 8.6 mg/dL (ref 8.4–10.5)
CO2: 22 mEq/L (ref 19–32)
Chloride: 106 mEq/L (ref 96–112)
Creatinine, Ser: 0.95 mg/dL (ref 0.50–1.35)
GFR calc Af Amer: >90 mL/min (ref >90)
GFR, EST NON AFRICAN AMERICAN: 84 mL/min — AB (ref >90)
GLUCOSE: 86 mg/dL (ref 70–99)
Potassium: 4.2 mEq/L (ref 3.7–5.3)
SODIUM: 140 meq/L (ref 137–147)

## 2014-04-03 LAB — TSH: TSH: 4 u[IU]/mL (ref 0.350–4.500)

## 2014-04-03 LAB — TROPONIN I: Troponin I: <0.3 ng/mL (ref <0.30)

## 2014-04-03 LAB — MRSA PCR SCREENING: MRSA by PCR: NEGATIVE

## 2014-04-03 SURGERY — PERMANENT PACEMAKER INSERTION
Anesthesia: LOCAL

## 2014-04-03 MED ORDER — LIDOCAINE HCL (PF) 1 % IJ SOLN
INTRAMUSCULAR | Status: AC
  Filled 2014-04-03: qty 30

## 2014-04-03 MED ORDER — SODIUM CHLORIDE 0.9 % IJ SOLN
3.0000 mL | Freq: Two times a day (BID) | INTRAMUSCULAR | Status: DC
  Administered 2014-04-03 (×3): 3 mL via INTRAVENOUS

## 2014-04-03 MED ORDER — ONDANSETRON HCL 4 MG/2ML IJ SOLN: 4.0000 mg | Freq: Four times a day (QID) | INTRAMUSCULAR | Status: DC | PRN

## 2014-04-03 MED ORDER — CEFAZOLIN SODIUM-DEXTROSE 2-3 GM-% IV SOLR
2.0000 g | INTRAVENOUS | Status: DC
Start: 2014-04-03 — End: 2014-04-03
  Filled 2014-04-03: qty 50

## 2014-04-03 MED ORDER — SODIUM CHLORIDE 0.9 % IV SOLN: INTRAVENOUS | Status: DC

## 2014-04-03 MED ORDER — FENTANYL CITRATE 0.05 MG/ML IJ SOLN
INTRAMUSCULAR | Status: AC
  Filled 2014-04-03: qty 2

## 2014-04-03 MED ORDER — HEPARIN (PORCINE) IN NACL 2-0.9 UNIT/ML-% IJ SOLN
INTRAMUSCULAR | Status: AC
  Filled 2014-04-03: qty 500

## 2014-04-03 MED ORDER — SODIUM CHLORIDE 0.9 % IR SOLN
80.0000 mg | Status: DC
  Filled 2014-04-03: qty 2

## 2014-04-03 MED ORDER — SODIUM CHLORIDE 0.9 % IJ SOLN: 3.0000 mL | INTRAMUSCULAR | Status: DC | PRN

## 2014-04-03 MED ORDER — OFF THE BEAT BOOK
Freq: Once | Status: AC
  Administered 2014-04-03: 11:00:00
  Filled 2014-04-03: qty 1

## 2014-04-03 MED ORDER — ACETAMINOPHEN 325 MG PO TABS: 325.0000 mg | ORAL_TABLET | ORAL | Status: DC | PRN

## 2014-04-03 MED ORDER — CEFAZOLIN SODIUM 1-5 GM-% IV SOLN
1.0000 g | Freq: Four times a day (QID) | INTRAVENOUS | Status: AC
  Administered 2014-04-03 – 2014-04-04 (×3): 1 g via INTRAVENOUS
  Filled 2014-04-03 (×4): qty 50

## 2014-04-03 MED ORDER — METOPROLOL TARTRATE 12.5 MG HALF TABLET
12.5000 mg | ORAL_TABLET | Freq: Two times a day (BID) | ORAL | Status: DC
  Administered 2014-04-03 – 2014-04-04 (×2): 12.5 mg via ORAL
  Filled 2014-04-03 (×3): qty 1

## 2014-04-03 MED ORDER — ACETAMINOPHEN 325 MG PO TABS: 650.0000 mg | ORAL_TABLET | ORAL | Status: DC | PRN

## 2014-04-03 MED ORDER — SODIUM CHLORIDE 0.9 % IV SOLN: 250.0000 mL | INTRAVENOUS | Status: DC | PRN

## 2014-04-03 MED ORDER — ENOXAPARIN SODIUM 40 MG/0.4ML ~~LOC~~ SOLN
40.0000 mg | SUBCUTANEOUS | Status: DC
  Administered 2014-04-03: 40 mg via SUBCUTANEOUS
  Filled 2014-04-03: qty 0.4

## 2014-04-03 MED ORDER — MIDAZOLAM HCL 5 MG/5ML IJ SOLN
INTRAMUSCULAR | Status: AC
  Filled 2014-04-03: qty 5

## 2014-04-03 NOTE — Progress Notes (Signed)
Pt had some runs of V-tach, pt is asymptomatic and states he feels fine, vital signs were stable, BP=131/63, P=53. Dr. Donnie Ahoilley notified, orders received for BMET to be drawn if not already ordered. Will continue to monitor pt.-----Anah Billard, rn

## 2014-04-03 NOTE — Progress Notes (Signed)
pts heart rate afib/ aflutter rate up to 140s non sustained for long but sustained sinus tachy. Cardiology paged to notify pt. Pt is sweaty but stated that it was from the SCDS and asked to have them removed because they were making him feel terrible.

## 2014-04-03 NOTE — Telephone Encounter (Signed)
Tracings from [employer] showing a  Pause of 9.8 seconds, auto triggered. Attempted to call patient-had to leave a voicemail message.

## 2014-04-03 NOTE — Progress Notes (Signed)
Echocardiogram 2D Echocardiogram has been performed.  Dorothey BasemanReel, Averil Digman M 04/03/2014, 9:29 AM

## 2014-04-03 NOTE — Consult Note (Signed)
     Reason for Consult: symptomatic bradycardia with 8-9 second daytime pauses  Referring Physician: Dr. Maisie Fusilley  Nathan Riley is an 68 y.o. male.   HPI: The patient is a 68 yo man who has been very active and developed syncope. He also has known PAF and was initially on beta blocker therapy which has been decreased. He has had recurrent atrial fib with an RVR and post termination pauses from atrial fib. He is now referred to consider PPM insertion. The patient has felt well otherwise. No chest pain or sob. He exercises regularly.   PMH: Past Medical History  Diagnosis Date  . Essential hypertension, benign   . Paroxysmal atrial fibrillation   . Sick sinus syndrome     PSHX: Past Surgical History  Procedure Laterality Date  . Hernia repair    . Knee surgery      FAMHX:No family history on file.  Social History:  reports that he has never smoked. He has never used smokeless tobacco. He reports that he drinks alcohol. He reports that he does not use illicit drugs.  Allergies: No Known Allergies  Medications: reviewed  Dg Chest Port 1 View  04/02/2014   CLINICAL DATA:  Palpitations and near syncope.  EXAM: PORTABLE CHEST - 1 VIEW  COMPARISON:  07/11/2010  FINDINGS: Normal heart size and mediastinal contours. No acute infiltrate or edema. No effusion or pneumothorax. No acute osseous findings.  IMPRESSION: No active disease.   Electronically Signed   By: Tiburcio PeaJonathan  Watts M.D.   On: 04/02/2014 23:04    ROS  As stated in the HPI and negative for all other systems.  Physical Exam  Vitals:Blood pressure 129/59, pulse 53, temperature 97.9 F (36.6 C), temperature source Oral, resp. rate 15, height 5\' 10"  (1.778 m), weight 194 lb 3.6 oz (88.1 kg), SpO2 96 %.  Well appearing 68 yo man, NAD HEENT: Unremarkable Neck:  No JVD, no thyromegally Lymphatics:  No adenopathy Back:  No CVA tenderness Lungs:  Clear with no wheezes, rales, or rhonchi HEART:  Regular rate rhythm, no  murmurs, no rubs, no clicks Abd:  soft, positive bowel sounds, no organomegally, no rebound, no guarding Ext:  2 plus pulses, no edema, no cyanosis, no clubbing Skin:  No rashes no nodules Neuro:  CN II through XII intact, motor grossly intact  Tele - nsr/sinus bradycardia  Assessment/Plan: 1. Symptomatic sinus node dysfunction with 8 second pauses 2. PAF with an RVR 3. HTN Rec: I have discussed the treatment options with the patient. The risks/benefits/goals/expectations of PPM insertion have been reviewed with the patient and his wife and he wishes to proceed.   Sharlot GowdaGregg TaylorMD 04/03/2014, 10:30 AM

## 2014-04-03 NOTE — Telephone Encounter (Signed)
New Prob    Pt is currently having device placed. Wife is calling regarding monitor and if it needs to be returned. Please call.

## 2014-04-03 NOTE — Telephone Encounter (Signed)
Follow up      Dr Ladona Ridgelaylor said pt will need to be on blood thinner.  Wife want to know what blood thinner Dr Katrinka BlazingSmith would recommend---it needs to be cheaper and less side effects if possible.  Dr Ladona Ridgelaylor told pt there was 4 new blood thinners to choose from.

## 2014-04-03 NOTE — Progress Notes (Signed)
Pts home dose of Metoprolol started back 12.5 Bid. MD Adolm JosephWhitlock.

## 2014-04-03 NOTE — Progress Notes (Signed)
Utilization review complete. Isidoro DonningAlesia Malay Fantroy RN CCM Case Mgmt phone [phone]  CARE MANAGEMENT NOTE 04/03/2014  Patient:  Gracelyn NurseBOWMAN,Nathan B   Account Number:  [id number]  Date Initiated:  04/03/2014  Documentation initiated by:  [facility]Tailey Top  Subjective/Objective Assessment:   a fib, PPM 04/03/2014     Action/Plan:   lives at home with wife Nathan Riley, [phone]   Anticipated DC Date:  04/05/2014   Anticipated DC Plan:  HOME/SELF CARE      DC Planning Services  CM consult      Choice offered to / List presented to:             Status of service:  In process, will continue to follow Medicare Important Message given?   (If response is "NO", the following Medicare IM given date fields will be blank) Date Medicare IM given:   Medicare IM given by:   Date Additional Medicare IM given:   Additional Medicare IM given by:    Discharge Disposition:    Per UR Regulation:    If discussed at Long Length of Stay Meetings, dates discussed:    Comments:  04/03/2014 1130 NCM spoke to pt and gave permission to speak to wife. Pt independent prior to hospital stay. Waiting final recommendations for home. Isidoro DonningAlesia Selina Tapper RN CCM Case Mgmt phone [phone]

## 2014-04-03 NOTE — CV Procedure (Signed)
SURGEON:  Lewayne BuntingGregg Taylor, MD     PREPROCEDURE DIAGNOSIS:  Symptomatic Bradycardia due to sinus node dysfunction    POSTPROCEDURE DIAGNOSIS:  Same as preprocedure diagnosis     PROCEDURES:   1. Pacemaker implantation.     INTRODUCTION: Nathan Riley is a 68 y.o. male  with a history of bradycardia who presents today for pacemaker implantation.  The patient reports intermittent episodes of dizziness over the past few months and was found to have 8-9 second pauses as well as atrial fib with an RVR.  No reversible causes have been identified that will take care of both problems.   The patient therefore presents today for pacemaker implantation.     DESCRIPTION OF PROCEDURE:  Informed written consent was obtained, and   the patient was brought to the electrophysiology lab in a fasting state.  The patient required no sedation for the procedure today.  The patients left chest was prepped and draped in the usual sterile fashion by the EP lab staff. The skin overlying the left deltopectoral region was infiltrated with lidocaine for local analgesia.  A 4-cm incision was made over the left deltopectoral region.  A left subcutaneous pacemaker pocket was fashioned using a combination of sharp and blunt dissection. Electrocautery was required to assure hemostasis. Blunt dissection was used to isolate the cephalic vein.        RA/RV Lead Placement: The left cephalic vein was therefore directly visualized and cannulated.  Through the left cephalic vein, a Medtronic 5076 (serial number [id number]) right atrial lead and a Medtronic 5076 (serial number [id number]) right ventricular lead were advanced with fluoroscopic visualization into the right atrial appendage and right ventricular apical septal positions respectively.  Initial atrial lead P- waves measured 2 mV with impedance of 613 ohms and a threshold of 0.5 V at 0.5 msec.  Right ventricular lead R-waves measured 12 mV with an impedance of 1100 ohms and a  threshold of 0.7 V at 0.5 msec.  Both leads were secured to the pectoralis fascia using #2-0 silk over the suture sleeves.   Device Placement:  The leads were then connected to a Medtronic Adapta L (serial number [id number]) pacemaker.  The pocket was irrigated with copious gentamicin solution.  The pacemaker was then placed into the pocket.  The pocket was then closed in 2 layers with 2.0 Vicryl suture for the subcutaneous and subcuticular layers.  Steri-Strips and a sterile dressing were then applied.  There were no early apparent complications.     CONCLUSIONS:   1. Successful implantation of a Medtronic dual-chamber pacemaker for symptomatic bradycardia due to sinus node dysfunction.  2. No early apparent complications.           Lewayne BuntingGregg Taylor, MD 04/03/2014 1:54 PM

## 2014-04-03 NOTE — Telephone Encounter (Signed)
Returned pt wife call.adv her that the best way to return pt cardiac monitor is to mail it back directly to the co.she should use the bos and return label provided.  Pt wife is upset, while still at home during pt episodes, they attempted to call the on call service 4 times. No one picked up, they left several messages and no one called them back. [facility] was also unable to ger through to the on call service and had EMS dispatched to pt home. Pt wife was frantically trying to reach someone for advice. She contacted pt pcp who instructed them to go to the ED asap. I apologized, adv her that I will send an update to management about their experience. Adv her to call the office if I can be of any further assistance.She thanked me for my assistance

## 2014-04-04 ENCOUNTER — Encounter (HOSPITAL_COMMUNITY): Payer: Self-pay | Admitting: Internal Medicine

## 2014-04-04 ENCOUNTER — Other Ambulatory Visit: Payer: Self-pay | Admitting: Pharmacist

## 2014-04-04 ENCOUNTER — Inpatient Hospital Stay (HOSPITAL_COMMUNITY): Payer: Medicare HMO

## 2014-04-04 DIAGNOSIS — Z950 Presence of cardiac pacemaker: Secondary | ICD-10-CM

## 2014-04-04 MED ORDER — METOPROLOL TARTRATE 25 MG PO TABS: 25.0000 mg | ORAL_TABLET | Freq: Two times a day (BID) | ORAL | Status: DC

## 2014-04-04 MED ORDER — APIXABAN 5 MG PO TABS: 5.0000 mg | ORAL_TABLET | Freq: Two times a day (BID) | ORAL | Status: AC

## 2014-04-04 MED ORDER — APIXABAN 5 MG PO TABS: 5.0000 mg | ORAL_TABLET | Freq: Two times a day (BID) | ORAL | Status: DC

## 2014-04-04 NOTE — Discharge Summary (Signed)
ELECTROPHYSIOLOGY PROCEDURE DISCHARGE SUMMARY    Patient ID: Nathan NurseBilly B Zahler,  MRN: [id number], DOB/AGE: 1946/03/29 68 y.o.  Admit date: 04/02/2014 Discharge date: 04/05/2014  Primary Care Physician: Alva GarnetSHELTON,KIMBERLY R., MD Primary Cardiologist: Katrinka BlazingSmith Electrophysiologist: Ladona Ridgelaylor  Primary Discharge Diagnosis:  Symptomatic bradycardia status post pacemaker implantation this admission  Secondary Discharge Diagnosis:  1.  Atrial fibrillation with RVR 2.  Hypertension  No Known Allergies   Procedures This Admission:  1.  Implantation of a MDT dual chamber PPM on 04-03-14 by Dr Ladona Ridgelaylor.  The patient received a MDT model number ADDRL1 PPM with model number 5076 right atrial lead and 5076 right ventricular lead. There were no immediate post procedure complications. 2.  CXR on 04-04-14 demonstrated no pneumothorax status post device implantation.   Brief HPI/Hospital Course:  Nathan Riley is a 68 y.o. male who was recently evaluated for syncope with event monitor.  This demonstrated AF with RVR as well as 8-9 second post termination pauses. Because of AF with RVR as well as symptomatic bradycardia, pacemaker implantation was recommended.  Risks, benefits, and alternatives to PPM implantation were reviewed with the patient who wished to proceed. The patient underwent implantation of a MDT dual chamber pacemaker with details as outlined above.  He  was monitored on telemetry overnight which demonstrated sinus rhythm with intermittent pacing.  Left chest was without hematoma or ecchymosis.  The device was interrogated and found to be functioning normally.  CXR was obtained and demonstrated no pneumothorax status post device implantation.  Wound care, arm mobility, and restrictions were reviewed with the patient.  Dr Ladona Ridgelaylor examined the patient and considered them stable for discharge to home.   This patients CHA2DS2-VASc Score and unadjusted Ischemic Stroke Rate (% per year) is equal to 2.2 %  stroke rate/year from a score of 2 Above score calculated as 1 point each if present [CHF, HTN, DM, Vascular=MI/PAD/Aortic Plaque, Age if 65-74, or Male] Above score calculated as 2 points each if present [Age > 75, or Stroke/TIA/TE]  The patient will be started on Eliquis 5mg  twice daily for anticoagulation at discharge.   Discharge Vitals: Blood pressure 122/67, pulse 61, temperature 97.9 F (36.6 C), temperature source Oral, resp. rate 20, height 5\' 10"  (1.778 m), weight 194 lb 3.6 oz (88.1 kg), SpO2 97 %.   Labs:   Lab Results  Component Value Date   WBC 6.1 04/02/2014   HGB 15.0 04/02/2014   HCT 44.0 04/02/2014   MCV 91.2 04/02/2014   PLT 136* 04/02/2014     Recent Labs Lab 04/03/14 0800  NA 140  K 4.2  CL 106  CO2 22  BUN 20  CREATININE 0.95  CALCIUM 8.6  GLUCOSE 86    Discharge Medications:    Medication List    TAKE these medications        fish oil-omega-3 fatty acids 1000 MG capsule  Take 2 g by mouth 2 (two) times daily.     Melatonin 5 MG Caps  Take 1 capsule by mouth 2 (two) times daily as needed (sleep).     metoprolol tartrate 25 MG tablet  Commonly known as:  LOPRESSOR  Take 1 tablet (25 mg total) by mouth 2 (two) times daily.     PA VITAMIN D-3 2000 UNITS Caps  Generic drug:  Cholecalciferol  Take 2,000 Units by mouth daily. daily.        Disposition:  Discharge Instructions    Diet - low sodium heart healthy  Complete by:  As directed      Increase activity slowly    Complete by:  As directed           Follow-up Information    Follow up with [facility] On 04/17/2014.   Why:  Wound check and device check at 12:00 noon.   Contact information:   [address] [place] [phone]       Duration of Discharge Encounter: less than 30 minutes including physician time.  Signed,  Leonia ReevesGregg Taylor,M.D.

## 2014-04-04 NOTE — Progress Notes (Signed)
Patient ID: Gracelyn NurseBilly B Sica, male   DOB: [birth year], 68 y.o.   MRN: [id number]    Patient Name: Gracelyn NurseBilly B Gin Date of Encounter: 04/04/2014     Principal Problem:   Sinus arrest Active Problems:   Near syncope   Paroxysmal atrial fibrillation   Sick sinus syndrome    SUBJECTIVE  C/o palpitations last night which corresponds to atrial fib with an RVR  CURRENT MEDS . metoprolol tartrate  12.5 mg Oral BID  . sodium chloride  3 mL Intravenous Q12H    OBJECTIVE  Filed Vitals:   04/04/14 0030 04/04/14 0400 04/04/14 0745 04/04/14 0800  BP:  111/66 145/90 127/62  Pulse:  59 66 58  Temp: 98 F (36.7 C) 97.8 F (36.6 C)    TempSrc: Oral Oral    Resp:  20 16 14   Height:      Weight:      SpO2:  95% 94% 90%    Intake/Output Summary (Last 24 hours) at 04/04/14 0831 Last data filed at 04/03/14 2005  Gross per 24 hour  Intake     50 ml  Output    300 ml  Net   -250 ml   Filed Weights   04/03/14 0155  Weight: 194 lb 3.6 oz (88.1 kg)    PHYSICAL EXAM  General: Pleasant, NAD. Neuro: Alert and oriented X 3. Moves all extremities spontaneously. Psych: Normal affect. HEENT:  Normal  Neck: Supple without bruits or JVD. Lungs:  Resp regular and unlabored, CTA. PM incision without hematoma. Heart: RRR no s3, s4, or murmurs. Abdomen: Soft, non-tender, non-distended, BS + x 4.  Extremities: No clubbing, cyanosis or edema. DP/PT/Radials 2+ and equal bilaterally.  Accessory Clinical Findings  CBC  Recent Labs  04/02/14 2240 04/02/14 2248  WBC 6.1  --   NEUTROABS 3.0  --   HGB 14.7 15.0  HCT 42.3 44.0  MCV 91.2  --   PLT 136*  --    Basic Metabolic Panel  Recent Labs  04/02/14 2248 04/03/14 0800  NA 142 140  K 4.0 4.2  CL 108 106  CO2  --  22  GLUCOSE 88 86  BUN 25* 20  CREATININE 1.20 0.95  CALCIUM  --  8.6   Liver Function Tests No results for input(s): AST, ALT, ALKPHOS, BILITOT, PROT, ALBUMIN in the last 72 hours. No results for input(s): LIPASE,  AMYLASE in the last 72 hours. Cardiac Enzymes  Recent Labs  04/03/14 0310 04/03/14 0800 04/03/14 1411  TROPONINI <0.30 <0.30 <0.30   BNP Invalid input(s): POCBNP D-Dimer No results for input(s): DDIMER in the last 72 hours. Hemoglobin A1C No results for input(s): HGBA1C in the last 72 hours. Fasting Lipid Panel No results for input(s): CHOL, HDL, LDLCALC, TRIG, CHOLHDL, LDLDIRECT in the last 72 hours. Thyroid Function Tests  Recent Labs  04/03/14 0310  TSH 4.000    TELE  NSR with atrial pacing  Radiology/Studies  Dg Chest Port 1 View  04/02/2014   CLINICAL DATA:  Palpitations and near syncope.  EXAM: PORTABLE CHEST - 1 VIEW  COMPARISON:  07/11/2010  FINDINGS: Normal heart size and mediastinal contours. No acute infiltrate or edema. No effusion or pneumothorax. No acute osseous findings.  IMPRESSION: No active disease.   Electronically Signed   By: Tiburcio PeaJonathan  Watts M.D.   On: 04/02/2014 23:04    ASSESSMENT AND PLAN  1. Symptomatic sinus node dysfunction with up to 8 second pauses and near syncope 2. Atrial fib with  a RVR 3. S/p PPM secondary to #1&2. 4. HTN Rec: ok for discharge home. He will need to go back on metoprolol 25 mg twice daily with additional uptitration as needed as an outpatient. I discussed systemic anti-coagulation with the patient and his is willing to try a NOAC. He will start Eliquis, 5 mg twice daily. He can go over to the office to get some samples.   Gregg Taylor,M.D.  04/04/2014 8:31 AM

## 2014-04-06 ENCOUNTER — Encounter (HOSPITAL_COMMUNITY): Payer: Self-pay | Admitting: *Deleted

## 2014-04-06 ENCOUNTER — Telehealth: Payer: Self-pay | Admitting: *Deleted

## 2014-04-06 NOTE — Telephone Encounter (Signed)
Called patient to verify that he had received Eliquis prescription.  Left message for patient to call back to confirm.  Gypsy BalsamAmber Jaythen Hamme, RN 04/06/2014 8:30 AM

## 2014-04-07 ENCOUNTER — Telehealth: Payer: Self-pay | Admitting: Internal Medicine

## 2014-04-07 NOTE — Telephone Encounter (Signed)
PT'S WIFE CALLING RE QUESTION RE BLOOD THINNER, ELIQUIS, BEFORE HE STARTS TAKING IT TONIGHT , PLS CALL [phone]

## 2014-04-07 NOTE — Telephone Encounter (Signed)
Talked to patient's wife about Eliquis and it's use. Patient's wife was afraid to start Eliquis, but informed her that it is to help prevent blood clots. Patient's wife verbalized understanding.

## 2014-04-11 NOTE — Telephone Encounter (Signed)
New Prob    Pt has a question regarding wound care. Please call.

## 2014-04-11 NOTE — Telephone Encounter (Signed)
Instructed wife to take outer dressing off but leave steri strips in place until he comes in for his wound check on 04/17/14.

## 2014-04-17 ENCOUNTER — Ambulatory Visit: Payer: Medicare HMO

## 2014-04-19 ENCOUNTER — Telehealth: Payer: Self-pay | Admitting: Cardiology

## 2014-04-19 ENCOUNTER — Ambulatory Visit (INDEPENDENT_AMBULATORY_CARE_PROVIDER_SITE_OTHER): Payer: Medicare HMO | Admitting: *Deleted

## 2014-04-19 ENCOUNTER — Encounter: Payer: Self-pay | Admitting: Internal Medicine

## 2014-04-19 DIAGNOSIS — I480 Paroxysmal atrial fibrillation: Secondary | ICD-10-CM

## 2014-04-19 DIAGNOSIS — I495 Sick sinus syndrome: Secondary | ICD-10-CM

## 2014-04-19 LAB — MDC_IDC_ENUM_SESS_TYPE_INCLINIC
Brady Statistic AP VP Percent: 0 %
Brady Statistic AP VS Percent: 95 %
Brady Statistic AS VP Percent: 0 %
Brady Statistic AS VS Percent: 5 %
Date Time Interrogation Session: 20151230104603
Lead Channel Pacing Threshold Amplitude: 0.5 V
Lead Channel Pacing Threshold Amplitude: 0.75 V
Lead Channel Pacing Threshold Pulse Width: 0.4 ms
Lead Channel Sensing Intrinsic Amplitude: 0.7 mV
Lead Channel Sensing Intrinsic Amplitude: 22.4 mV
MDC IDC MSMT BATTERY IMPEDANCE: 100 Ohm
MDC IDC MSMT BATTERY REMAINING LONGEVITY: 128 mo
MDC IDC MSMT BATTERY VOLTAGE: 2.79 V
MDC IDC MSMT LEADCHNL RA IMPEDANCE VALUE: 562 Ohm
MDC IDC MSMT LEADCHNL RV IMPEDANCE VALUE: 781 Ohm
MDC IDC MSMT LEADCHNL RV PACING THRESHOLD PULSEWIDTH: 0.4 ms
MDC IDC SET LEADCHNL RA PACING AMPLITUDE: 3.5 V
MDC IDC SET LEADCHNL RV PACING AMPLITUDE: 3.5 V
MDC IDC SET LEADCHNL RV PACING PULSEWIDTH: 0.4 ms
MDC IDC SET LEADCHNL RV SENSING SENSITIVITY: 5.6 mV

## 2014-04-19 NOTE — Progress Notes (Signed)
Wound check appointment. Steri-strips removed. Wound without redness or edema. Incision edges approximated, wound well healed. Normal device function. Thresholds, sensing, and impedances consistent with implant measurements. Device programmed at 3.5V/auto capture programmed on for extra safety margin until 3 month visit. Histogram distribution appropriate for patient and level of activity. 14 mode switches--- <0.1% + eliquis. No high ventricular rates noted. Patient educated about wound care, arm mobility, lifting restrictions. ROV w/ Dr. Ladona Ridgelaylor 07/04/14.

## 2014-04-19 NOTE — Telephone Encounter (Signed)
LMOVM informing pt that home monitor is ready for use.

## 2014-04-20 ENCOUNTER — Telehealth: Payer: Self-pay | Admitting: Cardiology

## 2014-04-20 NOTE — Telephone Encounter (Signed)
Pt wife is calling and has a few questions regarding interrogation from Wednesday 04-19-14. Please call her back either before 11 or after 12.

## 2014-04-20 NOTE — Telephone Encounter (Signed)
Verified pt is taking eliquis as instructed due to PAF. Spouse states they are compliant. Spouse understands pt's ppm can see & record PAF but cannot intervene. Also clarified eliquis does not treat Afib but prevents potential strokes.   Clarified upcoming appt dates w/ Scott & Dr. Alben SpittleWeaver.

## 2014-05-07 ENCOUNTER — Telehealth: Payer: Self-pay | Admitting: Internal Medicine

## 2014-05-07 NOTE — Telephone Encounter (Signed)
Sharmaine BaseKitty Gillentine, pt's wife, called because patient felt like his heart was 'skipping beats.' Otherwise completely asx with no dizziness/LH, syncope/presyncope. PPM was placed 1 month ago for symptomatic bradycardia in the setting of AF. I advised them to come to the ED if new or concerning sxs, otherwise to call clinic on Tuesday AM to schedule appt and interrogation. She voiced agreement w/ this plan.  Sammuel HinesAmit N. Camrin Lapre, MD MPH

## 2014-05-10 ENCOUNTER — Encounter: Payer: Self-pay | Admitting: Interventional Cardiology

## 2014-05-10 ENCOUNTER — Telehealth: Payer: Self-pay | Admitting: Internal Medicine

## 2014-05-10 ENCOUNTER — Ambulatory Visit (INDEPENDENT_AMBULATORY_CARE_PROVIDER_SITE_OTHER): Payer: Medicare HMO | Admitting: *Deleted

## 2014-05-10 DIAGNOSIS — Z950 Presence of cardiac pacemaker: Secondary | ICD-10-CM | POA: Insufficient documentation

## 2014-05-10 DIAGNOSIS — I495 Sick sinus syndrome: Secondary | ICD-10-CM | POA: Insufficient documentation

## 2014-05-10 DIAGNOSIS — I480 Paroxysmal atrial fibrillation: Secondary | ICD-10-CM

## 2014-05-10 LAB — MDC_IDC_ENUM_SESS_TYPE_INCLINIC
Battery Impedance: 100 Ohm
Battery Voltage: 2.79 V
Brady Statistic AP VP Percent: 0 %
Brady Statistic AP VS Percent: 97 %
Brady Statistic AS VS Percent: 3 %
Date Time Interrogation Session: 20160120112609
Lead Channel Impedance Value: 792 Ohm
Lead Channel Pacing Threshold Amplitude: 0.625 V
Lead Channel Pacing Threshold Pulse Width: 0.4 ms
MDC IDC MSMT BATTERY REMAINING LONGEVITY: 126 mo
MDC IDC MSMT LEADCHNL RA IMPEDANCE VALUE: 548 Ohm
MDC IDC MSMT LEADCHNL RV PACING THRESHOLD AMPLITUDE: 0.5 V
MDC IDC MSMT LEADCHNL RV PACING THRESHOLD PULSEWIDTH: 0.4 ms
MDC IDC MSMT LEADCHNL RV SENSING INTR AMPL: 16 mV
MDC IDC SET LEADCHNL RA PACING AMPLITUDE: 3.5 V
MDC IDC SET LEADCHNL RV PACING AMPLITUDE: 3.5 V
MDC IDC SET LEADCHNL RV PACING PULSEWIDTH: 0.4 ms
MDC IDC SET LEADCHNL RV SENSING SENSITIVITY: 5.6 mV
MDC IDC STAT BRADY AS VP PERCENT: 0 %

## 2014-05-10 NOTE — Telephone Encounter (Signed)
New message       1. Has your device fired? no  2. Is you device beeping? no  3. Are you experiencing draining or swelling at device site? no  4. Are you calling to see if we received your device transmission? no 5. Have you passed out?no Pt had pacemaker put in recently.  Pt went "fast walking" on Sunday. After walking, his heart started skipping beats.  It stopped.  Then this am it started skipping beats again----bp and pulse goes up while skipping.  Pt want to know if he needs to come in and have a pacemaker ck

## 2014-05-10 NOTE — Telephone Encounter (Signed)
After consulting w/ a device tech she requested that I call pt and request he send a manual transmission. Called pt and spoke w/ his wife she insist that pt come into office and have device interrogated. Pt wife agreed to appt today at 11:00 AM.

## 2014-05-10 NOTE — Progress Notes (Signed)
Pacemaker check in clinic for c/o "fluttering."  Interrogation shows 40 mode switch episodes the longest > 12 minutes.  Max A > 400/max V 192 bpm, + eliquis.  Total burden since wound check 04/19/14 0.3%.  ROV 05/15/14 with Tereso NewcomerScott Weaver for metoprolol titration.  The patient and his wife are extremely anxious and we spent a considerable amount of time today answering questions which seemed to alleviate some of their apprehension.

## 2014-05-11 ENCOUNTER — Encounter: Payer: Medicare HMO | Admitting: Physician Assistant

## 2014-05-15 ENCOUNTER — Ambulatory Visit (INDEPENDENT_AMBULATORY_CARE_PROVIDER_SITE_OTHER): Payer: Medicare HMO | Admitting: Physician Assistant

## 2014-05-15 ENCOUNTER — Encounter: Payer: Self-pay | Admitting: Physician Assistant

## 2014-05-15 VITALS — BP 111/70 | HR 64 | Ht 70.0 in | Wt 204.0 lb

## 2014-05-15 DIAGNOSIS — I480 Paroxysmal atrial fibrillation: Secondary | ICD-10-CM

## 2014-05-15 DIAGNOSIS — I4891 Unspecified atrial fibrillation: Secondary | ICD-10-CM

## 2014-05-15 DIAGNOSIS — I10 Essential (primary) hypertension: Secondary | ICD-10-CM

## 2014-05-15 MED ORDER — METOPROLOL TARTRATE 25 MG PO TABS: 37.5000 mg | ORAL_TABLET | Freq: Two times a day (BID) | ORAL | Status: AC

## 2014-05-15 NOTE — Patient Instructions (Signed)
Your physician has recommended you make the following change in your medication:  1. INCREASE METOPROLOL TO 37.5 MG TWICE DAILY (THIS WILL BE 1 AND 1/2 TABS TWICE DAILY) 2. IF PALPITATIONS CONTINUE AFTER ABOUT 1-2 WEEKS THEN INCREASE METOPROLOL TO 50 MG TWICE DAILY (2 TABS TWICE DAILY) IF YOU DO THIS PLEASE CALL THE OFFICE TO LET SCOTT WEAVER, PAC KNOW OF THIS CHANGE. [phone]  FOLLOW UP WITH DR. Katrinka BlazingSMITH IN 3-4 WEEKS

## 2014-05-15 NOTE — Progress Notes (Signed)
Cardiology Office Note   Date:  05/15/2014   ID:  Nathan Riley, DOB 1945-05-24, MRN [id number]  PCP:  Alva Garnet., MD  Cardiologist:  Dr. Verdis Prime   Electrophysiologist:  Dr. Lewayne Bunting   Chief Complaint  Patient presents with  . Atrial Fibrillation    s/p pacemaker (tachy-brady syndrome)    History of Present Illness: Nathan Riley is a 69 y.o. male who presents for FU on the above.   He has a hx of HTN.  He was evaluated by Dr. Verdis Prime 03/30/14 for near syncope with associated palpitations .  Event monitor demonstrated pauses as long as 8-9 seconds. He was brought to the emergency room and was noted to be in atrial fibrillation with RVR. He converted to sinus rhythm and was noted to have posttermination pauses of between 8 and 9 seconds.  Pacemaker implantation was recommended. This was performed by Dr. Ladona Ridgel with a dual-chamber Medtronic device. He was also placed on Apixaban for anticoagulation.  He was discharged on 04/05/14.  He had his device checked on 1/20 and was noted to have several mode switches with high ventricular rates up to 192 bpm. He was brought back today for further titration of his beta blocker.    Today, he is here with his wife. He tells me that he's had several episodes of feeling like his heart is racing. These seem to correspond with the mode switches annotated last week. He feels strange when this happens. However, he denies the feeling of near syncope that he had prior to his pacemaker implantation. He denies chest discomfort. He denies exertional chest pain. He denies significant exertional shortness of breath. He denies orthopnea, PND or edema.   Studies/Reports Reviewed Today:   - Echocardiogram (04/03/14):  LVH, EF 55-60%, mild LAE   Past Medical History  Diagnosis Date  . Essential hypertension, benign   . Paroxysmal atrial fibrillation   . Tachy-brady syndrome     post term pauses (8-9 secs) >>> s/p pacemaker 03/2014  . Hx  of echocardiogram     a.  Echocardiogram (04/03/14):  LVH, EF 55-60%, mild LAE    Past Surgical History  Procedure Laterality Date  . Hernia repair    . Knee surgery    . Permanent pacemaker insertion N/A 04/03/2014    MDT dual chamber pacemaker implanted by Dr Ladona Ridgel for symptomatic bradycardia     Current Outpatient Prescriptions  Medication Sig Dispense Refill  . apixaban (ELIQUIS) 5 MG TABS tablet Take 1 tablet (5 mg total) by mouth 2 (two) times daily. 60 tablet 0  . Cholecalciferol (PA VITAMIN D-3) 2000 UNITS CAPS Take 2,000 Units by mouth daily. daily.    . fish oil-omega-3 fatty acids 1000 MG capsule Take 2 g by mouth 2 (two) times daily.     . Melatonin 5 MG CAPS Take 1 capsule by mouth 2 (two) times daily as needed (sleep).     . metoprolol tartrate (LOPRESSOR) 25 MG tablet Take 1 tablet (25 mg total) by mouth 2 (two) times daily. 60 tablet 3   No current facility-administered medications for this visit.    Allergies:   Review of patient's allergies indicates no known allergies.    Social History:  The patient  reports that he has never smoked. He has never used smokeless tobacco. He reports that he drinks alcohol. He reports that he does not use illicit drugs.   Family History:  The patient's family history includes Heart attack  in his mother; Heart attack (age of onset: 8438) in his brother; Hypertension in his brother and mother. There is no history of Stroke.    ROS:  Please see the history of present illness.   Otherwise, review of systems are positive for none.   There is no hx of snoring.  All other systems are reviewed and negative.    PHYSICAL EXAM: VS:  BP 111/70 mmHg  Pulse 64  Ht 5\' 10"  (1.778 m)  Wt 204 lb (92.534 kg)  BMI 29.27 kg/m2    Wt Readings from Last 3 Encounters:  05/15/14 204 lb (92.534 kg)  04/03/14 194 lb 3.6 oz (88.1 kg)  03/30/14 198 lb 12.8 oz (90.175 kg)     GEN: Well nourished, well developed, in no acute distress HEENT:  normal Neck: no JVD, no carotid bruits, no masses Cardiac:  Normal S1/S2, RRR; no murmur, no rubs or gallops, no edema  Respiratory:  clear to auscultation bilaterally, no wheezing, rhonchi or rales. GI: soft, nontender, nondistended, + BS MS: no deformity or atrophy Skin: warm and dry  Neuro:  CNs II-XII intact, Strength and sensation are intact Psych: Normal affect   EKG:  EKG is ordered today.  It demonstrates:   A paced, HR 64, no ST changes   Recent Labs: 04/02/2014: Hemoglobin 15.0; Platelets 136* 04/03/2014: BUN 20; Creatinine 0.95; Potassium 4.2; Sodium 140; TSH 4.000    Lipid Panel No results found for: CHOL, TRIG, HDL, CHOLHDL, VLDL, LDLCALC, LDLDIRECT   ASSESSMENT AND PLAN:  1.  Paroxysmal Atrial Fibrillation:  He has had some symptomatic episodes of PAF with RVR.  We had a long discussion regarding increasing his rate controlling medications vs adding an AAD.  He is hesitant to start on antiarrhythmic drug therapy. I'm not certain how much benefit he will receive from increasing his dose of beta blocker. I'm also not certain how much his blood pressure will tolerate a higher dose of beta blocker.    -  Increase metoprolol tartrate to 37.5 mg twice a day.    -  If he continues to palpitations, I have told him to increase his metoprolol tartrate 50 mg twice a day.    -  I think he would be a good candidate for flecainide. We should certainly consider this if he fails increasing his dose of metoprolol.  He has a strong FHx of CAD and would need an ETT-Myoview 1 week after starting if we choose this drug.    -  I also told him to take an extra 12.5 mg of metoprolol should he have recurrent palpitations that are prolonged. He knows to call the on-call provider or go the emergency room if his symptoms remain prolonged.    -  Continue Apixaban.  He is tolerating this.   2.  Tachy-Brady Syndrome s/p Pacemaker:  FU with Dr. Lewayne BuntingGregg Taylor as planned.  3.  Hypertension:  Controlled.   He is tolerating Metoprolol Tartrate.    Current medicines are reviewed at length with the patient today.  The patient has concerns regarding medicines.  The following changes have been made:  As outlined above.   Labs/ tests ordered today include:  Orders Placed This Encounter  Procedures  . EKG 12-Lead     Disposition:   FU with Dr. Verdis PrimeHenry Smith in 3 weeks and Dr. Lewayne BuntingGregg Taylor as planned.    Signed, Brynda RimScott Daejah Klebba, PA-C, MHS 05/15/2014 10:46 AM    [facility] [address]  [address] [place], [place]  [address] Phone: [phone]; Fax: [phone]

## 2014-05-16 NOTE — Progress Notes (Signed)
Reviewed with Dr. Verdis PrimeHenry Riley. He suggests starting Sotalol AF 80 mg BID. I spoke to the patient by phone.  He prefers to try a higher dose of Metoprolol first. He understands he would need admission to the hospital for 3 days if we need to start Sotalol. He will FU as planned. Nathan NewcomerScott Carter Kassel, PA-C   05/16/2014 10:53 AM

## 2014-05-26 ENCOUNTER — Encounter: Payer: Self-pay | Admitting: Internal Medicine

## 2014-06-22 ENCOUNTER — Ambulatory Visit (INDEPENDENT_AMBULATORY_CARE_PROVIDER_SITE_OTHER): Payer: Medicare HMO | Admitting: Interventional Cardiology

## 2014-06-22 ENCOUNTER — Encounter: Payer: Self-pay | Admitting: Interventional Cardiology

## 2014-06-22 VITALS — BP 120/80 | HR 81 | Ht 70.0 in | Wt 204.0 lb

## 2014-06-22 DIAGNOSIS — Z950 Presence of cardiac pacemaker: Secondary | ICD-10-CM

## 2014-06-22 DIAGNOSIS — I10 Essential (primary) hypertension: Secondary | ICD-10-CM

## 2014-06-22 DIAGNOSIS — I480 Paroxysmal atrial fibrillation: Secondary | ICD-10-CM

## 2014-06-22 DIAGNOSIS — I495 Sick sinus syndrome: Secondary | ICD-10-CM

## 2014-06-22 NOTE — Patient Instructions (Signed)
Your physician recommends that you continue on your current medications as directed. Please refer to the Current Medication list given to you today.  Your physician wants you to follow-up in: 6 months with Dr.Smith You will receive a reminder letter in the mail two months in advance. If you don't receive a letter, please call our office to schedule the follow-up appointment.  

## 2014-06-22 NOTE — Progress Notes (Signed)
Cardiology Office Note   Date:  06/22/2014   ID:  Nathan NurseBilly B Anand, DOB 30-Aug-1945, MRN [id number]  PCP:  Alva GarnetSHELTON,KIMBERLY R., MD  Cardiologist:   Lesleigh NoeSMITH III,HENRY W, MD   No chief complaint on file.     History of Present Illness: Nathan Riley is a 69 y.o. male who presents for follow-up with tachybradycardia syndrome. His wife is distraught that no one has given them information concerning the nitroglycerin history of her husband's heart problem, and she has been worrying that he is near death. He is felt well. She has multiple questions that markedly prolonged the office visit.  The patient denies any prolonged episodes of palpitation. He has not had syncope, dizziness, chills, fever, or any complaints since pacemaker implantation.    Past Medical History  Diagnosis Date  . Essential hypertension, benign   . Paroxysmal atrial fibrillation   . Tachy-brady syndrome     post term pauses (8-9 secs) >>> s/p pacemaker 03/2014  . Hx of echocardiogram     a.  Echocardiogram (04/03/14):  LVH, EF 55-60%, mild LAE    Past Surgical History  Procedure Laterality Date  . Hernia repair    . Knee surgery    . Permanent pacemaker insertion N/A 04/03/2014    MDT dual chamber pacemaker implanted by Dr Ladona Ridgelaylor for symptomatic bradycardia     Current Outpatient Prescriptions  Medication Sig Dispense Refill  . apixaban (ELIQUIS) 5 MG TABS tablet Take 1 tablet (5 mg total) by mouth 2 (two) times daily. 60 tablet 0  . Cholecalciferol (PA VITAMIN D-3) 2000 UNITS CAPS Take 2,000 Units by mouth daily. daily.    . fish oil-omega-3 fatty acids 1000 MG capsule Take 2 g by mouth 2 (two) times daily.     . Melatonin 5 MG CAPS Take 1 capsule by mouth 2 (two) times daily as needed (sleep).     . metoprolol tartrate (LOPRESSOR) 25 MG tablet Take 1.5 tablets (37.5 mg total) by mouth 2 (two) times daily. 90 tablet 6   No current facility-administered medications for this visit.    Allergies:    Review of patient's allergies indicates no known allergies.    Social History:  The patient  reports that he has never smoked. He has never used smokeless tobacco. He reports that he drinks alcohol. He reports that he does not use illicit drugs.   Family History:  The patient's family history includes Heart attack in his mother; Heart attack (age of onset: 6038) in his brother; Hypertension in his brother and mother. There is no history of Stroke.    ROS:  Please see the history of present illness.   Otherwise, review of systems are positive for anxiety about doing physical activity. States in no one explained to him what he could or could not do. This is caused the wife to have grade anxiety. He denies medication side effects..   All other systems are reviewed and negative.    PHYSICAL EXAM: VS:  BP 120/80 mmHg  Pulse 81  Ht 5\' 10"  (1.778 m)  Wt 204 lb (92.534 kg)  BMI 29.27 kg/m2  SpO2 96% , BMI Body mass index is 29.27 kg/(m^2). GEN: Well nourished, well developed, in no acute distress HEENT: normal Neck: no JVD, carotid bruits, or masses Cardiac: RRR; no murmurs, rubs, or gallops,no edema . Pacer pocket is unremarkable. Respiratory:  clear to auscultation bilaterally, normal work of breathing GI: soft, nontender, nondistended, + BS MS: no deformity or  atrophy Skin: warm and dry, no rash Neuro:  Strength and sensation are intact Psych: euthymic mood, full affect   EKG:  EKG is not ordered today.   Recent Labs: 04/02/2014: Hemoglobin 15.0; Platelets 136* 04/03/2014: BUN 20; Creatinine 0.95; Potassium 4.2; Sodium 140; TSH 4.000    Lipid Panel No results found for: CHOL, TRIG, HDL, CHOLHDL, VLDL, LDLCALC, LDLDIRECT    Wt Readings from Last 3 Encounters:  06/22/14 204 lb (92.534 kg)  05/15/14 204 lb (92.534 kg)  04/03/14 194 lb 3.6 oz (88.1 kg)      Other studies Reviewed: Additional studies/ records that were reviewed today include.    ASSESSMENT AND PLAN:     Paroxysmal atrial fibrillation: Episodes and frequent and he is on anticoagulation coverage to prevent stroke.  Tachy-brady syndrome: Atrial fibrillation and intermittent post conversion pauses.  Pacemaker: DDD pacer  Essential hypertension: Controlled     Current medicines are reviewed at length with the patient today.  The patient has concerns regarding medicines.  The following changes have been made:  no change. Prolonged discussion with patient and wife concerning the atrophy history, risk of death, implication of the need for pacemaker therapy, the potential naturally history of atrial fibrillation with increasing episodes and requirement for antiarrhythmic therapy. The Natrecor history and risk of bleeding on anticoagulation therapy.  Labs/ tests ordered today include:  No orders of the defined types were placed in this encounter.     Disposition:   FU with Mendel Ryder in 6 months   Signed, Lesleigh Noe, MD  06/22/2014 11:48 AM    [facility] [address] [place], [place], [place]  [address] Phone: [phone]; Fax: [phone]

## 2014-07-04 ENCOUNTER — Encounter: Payer: Self-pay | Admitting: Internal Medicine

## 2014-07-04 ENCOUNTER — Ambulatory Visit (INDEPENDENT_AMBULATORY_CARE_PROVIDER_SITE_OTHER): Payer: Medicare HMO | Admitting: Internal Medicine

## 2014-07-04 VITALS — BP 122/68 | HR 82 | Ht 70.0 in | Wt 203.4 lb

## 2014-07-04 DIAGNOSIS — I495 Sick sinus syndrome: Secondary | ICD-10-CM

## 2014-07-04 DIAGNOSIS — I480 Paroxysmal atrial fibrillation: Secondary | ICD-10-CM

## 2014-07-04 DIAGNOSIS — I10 Essential (primary) hypertension: Secondary | ICD-10-CM

## 2014-07-04 DIAGNOSIS — Z950 Presence of cardiac pacemaker: Secondary | ICD-10-CM

## 2014-07-04 LAB — MDC_IDC_ENUM_SESS_TYPE_INCLINIC
Battery Voltage: 2.78 V
Brady Statistic AP VP Percent: 0 %
Brady Statistic AP VS Percent: 96 %
Brady Statistic AS VP Percent: 0 %
Date Time Interrogation Session: 20160315141058
Lead Channel Impedance Value: 533 Ohm
Lead Channel Impedance Value: 850 Ohm
Lead Channel Pacing Threshold Pulse Width: 0.4 ms
Lead Channel Sensing Intrinsic Amplitude: 22.4 mV
Lead Channel Setting Pacing Amplitude: 2 V
Lead Channel Setting Pacing Amplitude: 2.5 V
Lead Channel Setting Pacing Pulse Width: 0.4 ms
Lead Channel Setting Sensing Sensitivity: 5.6 mV
MDC IDC MSMT BATTERY IMPEDANCE: 100 Ohm
MDC IDC MSMT BATTERY REMAINING LONGEVITY: 163 mo
MDC IDC MSMT LEADCHNL RA PACING THRESHOLD AMPLITUDE: 0.75 V
MDC IDC MSMT LEADCHNL RA SENSING INTR AMPL: 1.4 mV
MDC IDC MSMT LEADCHNL RV PACING THRESHOLD AMPLITUDE: 0.5 V
MDC IDC MSMT LEADCHNL RV PACING THRESHOLD PULSEWIDTH: 0.4 ms
MDC IDC STAT BRADY AS VS PERCENT: 4 %

## 2014-07-04 NOTE — Patient Instructions (Signed)
Your physician wants you to follow-up in: 9 months with Dr Court Joyaylor You will receive a reminder letter in the mail two months in advance. If you don't receive a letter, please call our office to schedule the follow-up appointment.  Remote monitoring is used to monitor your Pacemaker or ICD from home. This monitoring reduces the number of office visits required to check your device to one time per year. It allows us to keep an eye on the functioning of your device to ensure it is working properly. You are scheduled for a device check from home on 10/03/14. You may send your transmission at any time that day. If you have a wireless device, the transmission will be sent automatically. After your physician reviews your transmission, you will receive a postcard with your next transmission date.

## 2014-07-04 NOTE — Assessment & Plan Note (Signed)
His [employer] DDD PM is working normally. Will recheck in several months. 

## 2014-07-04 NOTE — Assessment & Plan Note (Signed)
He is maintaining NSR over 99% of the time. Will follow. No change in meds.

## 2014-07-04 NOTE — Progress Notes (Signed)
      HPI Mr. Orson SlickBowman returns today for followup of his PPM. He is a pleasant 69 yo man with a h/o PAF and long post termination pauses who underwent PPM insertion several months ago. In the interim, he has done well. He is exercising regularly. No syncoe or sob or chest pain.  No Known Allergies   Current Outpatient Prescriptions  Medication Sig Dispense Refill  . apixaban (ELIQUIS) 5 MG TABS tablet Take 1 tablet (5 mg total) by mouth 2 (two) times daily. 60 tablet 0  . Cholecalciferol (PA VITAMIN D-3) 2000 UNITS CAPS Take 2,000 Units by mouth daily.     . Melatonin 5 MG CAPS Take 1 capsule by mouth 2 (two) times daily as needed (sleep).     . metoprolol tartrate (LOPRESSOR) 25 MG tablet Take 1.5 tablets (37.5 mg total) by mouth 2 (two) times daily. 90 tablet 6   No current facility-administered medications for this visit.     Past Medical History  Diagnosis Date  . Essential hypertension, benign   . Paroxysmal atrial fibrillation   . Tachy-brady syndrome     post term pauses (8-9 secs) >>> s/p pacemaker 03/2014  . Hx of echocardiogram     a.  Echocardiogram (04/03/14):  LVH, EF 55-60%, mild LAE    ROS:   All systems reviewed and negative except as noted in the HPI.   Past Surgical History  Procedure Laterality Date  . Hernia repair    . Knee surgery    . Permanent pacemaker insertion N/A 04/03/2014    MDT dual chamber pacemaker implanted by Dr Ladona Ridgelaylor for symptomatic bradycardia     Family History  Problem Relation Age of Onset  . Heart attack Mother   . Heart attack Brother 38    CABG  . Stroke Neg Hx   . Hypertension Mother   . Hypertension Brother      History   Social History  . Marital Status: Married    Spouse Name: N/A  . Number of Children: N/A  . Years of Education: N/A   Occupational History  . Not on file.   Social History Main Topics  . Smoking status: Never Smoker   . Smokeless tobacco: Never Used  . Alcohol Use: 0.0 oz/week    0  Standard drinks or equivalent per week  . Drug Use: No  . Sexual Activity: Not on file   Other Topics Concern  . Not on file   Social History Narrative   Appliance repair         BP 122/68 mmHg  Pulse 82  Ht 5\' 10"  (1.778 m)  Wt 203 lb 6.4 oz (92.262 kg)  BMI 29.18 kg/m2  Physical Exam:  Well appearing NAD HEENT: Unremarkable Neck:  No JVD, no thyromegally Lymphatics:  No adenopathy Back:  No CVA tenderness Lungs:  Clear HEART:  Regular rate rhythm, no murmurs, no rubs, no clicks Abd:  soft, positive bowel sounds, no organomegally, no rebound, no guarding Ext:  2 plus pulses, no edema, no cyanosis, no clubbing Skin:  No rashes no nodules Neuro:  CN II through XII intact, motor grossly intact  EKG - nsr with atrial pacing  DEVICE  Normal device function.  See PaceArt for details.   Assess/Plan:

## 2014-07-04 NOTE — Assessment & Plan Note (Signed)
His blood pressure is well controlled. I have encouraged the patient to maintain a low sodium diet, exercise and keep exercising.

## 2014-07-12 ENCOUNTER — Encounter: Payer: Self-pay | Admitting: Internal Medicine

## 2014-10-03 ENCOUNTER — Ambulatory Visit (INDEPENDENT_AMBULATORY_CARE_PROVIDER_SITE_OTHER): Payer: Medicare HMO | Admitting: *Deleted

## 2014-10-03 DIAGNOSIS — I495 Sick sinus syndrome: Secondary | ICD-10-CM | POA: Diagnosis not present

## 2014-10-03 NOTE — Progress Notes (Signed)
Remote pacemaker transmission.   

## 2014-10-07 LAB — CUP PACEART REMOTE DEVICE CHECK
Battery Impedance: 100 Ohm
Battery Voltage: 2.78 V
Brady Statistic AP VP Percent: 0 %
Brady Statistic AS VS Percent: 8 %
Lead Channel Impedance Value: 793 Ohm
Lead Channel Pacing Threshold Amplitude: 0.5 V
Lead Channel Pacing Threshold Pulse Width: 0.4 ms
MDC IDC MSMT BATTERY REMAINING LONGEVITY: 147 mo
MDC IDC MSMT LEADCHNL RA IMPEDANCE VALUE: 539 Ohm
MDC IDC MSMT LEADCHNL RA PACING THRESHOLD AMPLITUDE: 0.875 V
MDC IDC MSMT LEADCHNL RA PACING THRESHOLD PULSEWIDTH: 0.4 ms
MDC IDC MSMT LEADCHNL RV SENSING INTR AMPL: 16 mV
MDC IDC SESS DTM: 20160614123922
MDC IDC SET LEADCHNL RA PACING AMPLITUDE: 2 V
MDC IDC SET LEADCHNL RV PACING AMPLITUDE: 2.5 V
MDC IDC SET LEADCHNL RV PACING PULSEWIDTH: 0.4 ms
MDC IDC SET LEADCHNL RV SENSING SENSITIVITY: 5.6 mV
MDC IDC STAT BRADY AP VS PERCENT: 92 %
MDC IDC STAT BRADY AS VP PERCENT: 0 %

## 2014-10-11 ENCOUNTER — Encounter: Payer: Self-pay | Admitting: Cardiology

## 2014-10-16 ENCOUNTER — Encounter: Payer: Self-pay | Admitting: Internal Medicine

## 2014-10-30 ENCOUNTER — Telehealth: Payer: Self-pay | Admitting: Internal Medicine

## 2014-10-30 NOTE — Telephone Encounter (Signed)
Spoke w/pt in regards to softball.

## 2014-10-30 NOTE — Telephone Encounter (Signed)
New message       Pt had a pacemaker put in in December.  Can he play softball?

## 2014-10-30 NOTE — Telephone Encounter (Signed)
Routing to device clinic 

## 2015-01-08 ENCOUNTER — Telehealth: Payer: Self-pay | Admitting: Internal Medicine

## 2015-01-08 NOTE — Telephone Encounter (Signed)
Returned pt wife call. Pt wife sts that pt needs VA disability form completed by Wed 9/21, to take with him to his appt at the [place] on 9/22. She sts that forms were dropped off on 9/13 Adv her that disability forms are sent to a 3rd party service ([employer]) to completer. The cost is $25 and it typically take 2 weeks. Pt wife sts that she was prepared to pay the $25, but was told there would be no charge. Pt and his wife have bee communicating with Selena Batten in [place]. Adv them I will talk with Selena Batten in the am for clarification and give them a call back. Pt wife voiced appreciation

## 2015-01-08 NOTE — Telephone Encounter (Signed)
NewMessage  Pt calling about VA forms that were dropped off to be signed early last week. Pt requested to be called back today. Please call back and discuss.

## 2015-01-09 NOTE — Telephone Encounter (Signed)
Follow Up ° °Pt returned call//  °

## 2015-01-09 NOTE — Telephone Encounter (Signed)
Returned pt wife call. Adv her that I do have pt VA form on hand. Dr.Smith will be out of the office the rest of the week. We will be unable to have the form completed by Thurs 9/22 as requested. Adv her that it may not  Be ready until late next week. I will call back as soon as the form is completed and ready for pick up. Pt wife voiced appreciations and verbalized understanding.

## 2015-01-30 ENCOUNTER — Telehealth: Payer: Self-pay | Admitting: Interventional Cardiology

## 2015-01-30 NOTE — Telephone Encounter (Signed)
New message     Wife calling asking for a call back today will be coming to [facility] tomorrow regarding form.

## 2015-01-30 NOTE — Telephone Encounter (Signed)
Returned pt wife call. Adv pt wife that form has not been completed and Dr.Smith will be out of the office for 2 weeks. Pt has seen Dr.Taylor in the past Adv pt that I will ask Dr.Taylor if he could sign of on the pt VA form as a courtesy. Adv pt wife I will call them tomorrow to let them know. She voiced appreciation and verbalized understanding.

## 2015-01-31 NOTE — Telephone Encounter (Signed)
Lmom. Pt VA form has been completed and signed by Dr.Taylor. It will be left at the front desk for pick up

## 2015-02-12 ENCOUNTER — Telehealth: Payer: Self-pay | Admitting: Interventional Cardiology

## 2015-02-12 NOTE — Telephone Encounter (Signed)
Dr Ladona Ridgelaylor completed form dated 01/31/15, will forward to Kelly/Dr Ladona Ridgelaylor

## 2015-02-12 NOTE — Telephone Encounter (Signed)
Spoke with wife and let her know I would put a copy out front for her to pick up. She asked what IHD stood for and I let her know ischemic heart disease

## 2015-02-12 NOTE — Telephone Encounter (Signed)
New message    Patient [place] calling [place]

## 2015-02-20 ENCOUNTER — Telehealth: Payer: Self-pay | Admitting: Interventional Cardiology

## 2015-02-20 NOTE — Telephone Encounter (Signed)
New Message  Pt wife requested to speak w/ RN- pt wife would not specify the nature of phone call. Please advise

## 2015-02-20 NOTE — Telephone Encounter (Signed)
Returned pt wife call. Nathan OppenheimKitty is calling to check on the status of the revised VA form that was dropped off for pt. The form was completed by Dr.Taylor. Pt and his wife are requesting that the form indicate the the pt is unable to work. Sheppard PentonAdv Nathan Riley that Dr.Taylor completed the form as a courtesy, Dr.Smith will be out of the office for a week. I will hold on to the form until Dr.Smith returns. Nathan OppenheimKitty state that the pt has not worked in years, adv her that Dr.Smith did not put the pt out of work, and I'm not sure he will sign off on pt form. She ask that Dr.Smith review the pt chart and complete the form asap. She sts that the have had to reschedule with the VA several times, and would like an answer by late next wk. Adv her that I cannot guarantee a time frame, but will fwd her rqst to Dr.Smith.

## 2015-03-02 ENCOUNTER — Telehealth: Payer: Self-pay | Admitting: Interventional Cardiology

## 2015-03-02 NOTE — Telephone Encounter (Signed)
Spoke with wife and she stated that she was checking on [place] paperwork that Nathan Riley is aware of. Informed Nathan Riley that I did find paperwork but it is not complete at this time. Nathan OppenheimKitty states that the [place] has been asking for this paperwork to be sent in ASAP and if there is anyway to complete this on Monday she would be very appreciative. Advised pt's wife that I would send this message to Nathan Riley and have her follow up with her when paperwork complete. Wife verbalized understanding and was appreciative for call and states that she would like for Nathan Riley to call her Monday when paperwork is complete.

## 2015-03-02 NOTE — Telephone Encounter (Signed)
New message     Wife calling asking about form that was to be complete by Dr. Katrinka BlazingSmith. Wife is asking for a call back today.     Wife aware that MD & nurse are not in the office  Today

## 2015-03-05 NOTE — Telephone Encounter (Signed)
Follow up      Returning a call to Dr Katrinka BlazingSmith

## 2015-03-05 NOTE — Telephone Encounter (Signed)
Pt wife has spoken with Dr.Smith directly. VA form has been completed and will be left at the front desk for pick

## 2015-03-08 ENCOUNTER — Telehealth: Payer: Self-pay | Admitting: Interventional Cardiology

## 2015-03-08 NOTE — Telephone Encounter (Signed)
New message     Wife calling status of form - was it put in the mail.

## 2015-03-08 NOTE — Telephone Encounter (Signed)
Pt wife aware form has been at the front desk for pick up since 11/14. She verbalized understanding, she will come by the office to pick it up.

## 2015-04-09 ENCOUNTER — Telehealth: Payer: Self-pay | Admitting: Interventional Cardiology

## 2015-04-09 NOTE — Telephone Encounter (Signed)
Spoke with pt wife. She sts that they are still working with the VA for the pt to continue to receive benefits (healthcare and medications) Pt wife sts that they turned in pt [place] form that was completed and signed by Dr.Smith. She sts that the [place] is now requesting a letter from the pt cardiologist indicating the dx/reason pt needed a pacemaker. She sts that they need the letter before the end of the year. Adv pt wife that Dr.Smith's office time was limited. I will fwd him the message and call back with his response. Pt wife verbalized understanding.

## 2015-04-09 NOTE — Telephone Encounter (Signed)
Returned pt wife call. lmtcb if assistance is still needed

## 2015-04-09 NOTE — Telephone Encounter (Signed)
New message      Talk to the nurse regarding a form that was completed for the patient

## 2015-04-09 NOTE — Telephone Encounter (Signed)
The diagnosis is tachycardia bradycardia program with atrial fibrillation and RVR as well as post conversion pauses and bradycardia.

## 2015-04-09 NOTE — Telephone Encounter (Signed)
Follow up    Returning call

## 2015-04-10 NOTE — Telephone Encounter (Signed)
Lmom. Letter requested from Dr.Smith has been left at the front desk for pick up

## 2015-04-30 ENCOUNTER — Telehealth: Payer: Self-pay | Admitting: Interventional Cardiology

## 2015-04-30 NOTE — Telephone Encounter (Deleted)
Error

## 2015-05-01 NOTE — Telephone Encounter (Signed)
Fwd to Dr.Smith to advise 

## 2015-05-01 NOTE — Telephone Encounter (Signed)
New message     1. What dental office are you calling from? Dr Kaylyn LayerNeill Morrison  2. What is your office phone and fax number?  Fax [phone]  What type of procedure is the patient having performed?  Teeth cleaning and xrays What date is procedure scheduled? Not scheduled 3. What is your question (ex. Antibiotics prior to procedure, holding medication-we need to know how long dentist wants pt to hold med)? Since last visit pt is on eliquis and has a pacemaker-   Are there any precautions regarding his cleaning and possible xrays

## 2015-05-03 NOTE — Telephone Encounter (Signed)
Follow up      Calling to see if Dr Katrinka BlazingSmith responded to clearance for cleaning and possible xrays?  Pt want to have this done next week.  The dental office is closed tomorrow.  Please fax clearance to [phone]

## 2015-05-03 NOTE — Telephone Encounter (Signed)
He does not need antibiotic prophylaxis. Okay to have cleaning.

## 2015-05-03 NOTE — Telephone Encounter (Signed)
Spoke with Doris at pt DDS. Adv her that pt does not need antibiotic prophylaxis and it would be ok for pt to have xrays. She sts that they need it writing. Adv her that Dr.Smith will be out of the office until 1/24.  I will fwd the message to him ,   Fax over his response when its received. Doris verbalized understanding.

## 2015-05-07 NOTE — Telephone Encounter (Signed)
Clearance placed in [facility] nurse fax box to be faxed to pt DDS

## 2016-04-08 IMAGING — CR DG CHEST 1V PORT
1 series · 1 of 1 positions shown · non-contrast
Comparison: 07/11/2010

CLINICAL DATA: Palpitations and near syncope.

EXAM:
PORTABLE CHEST - 1 VIEW

[AP]
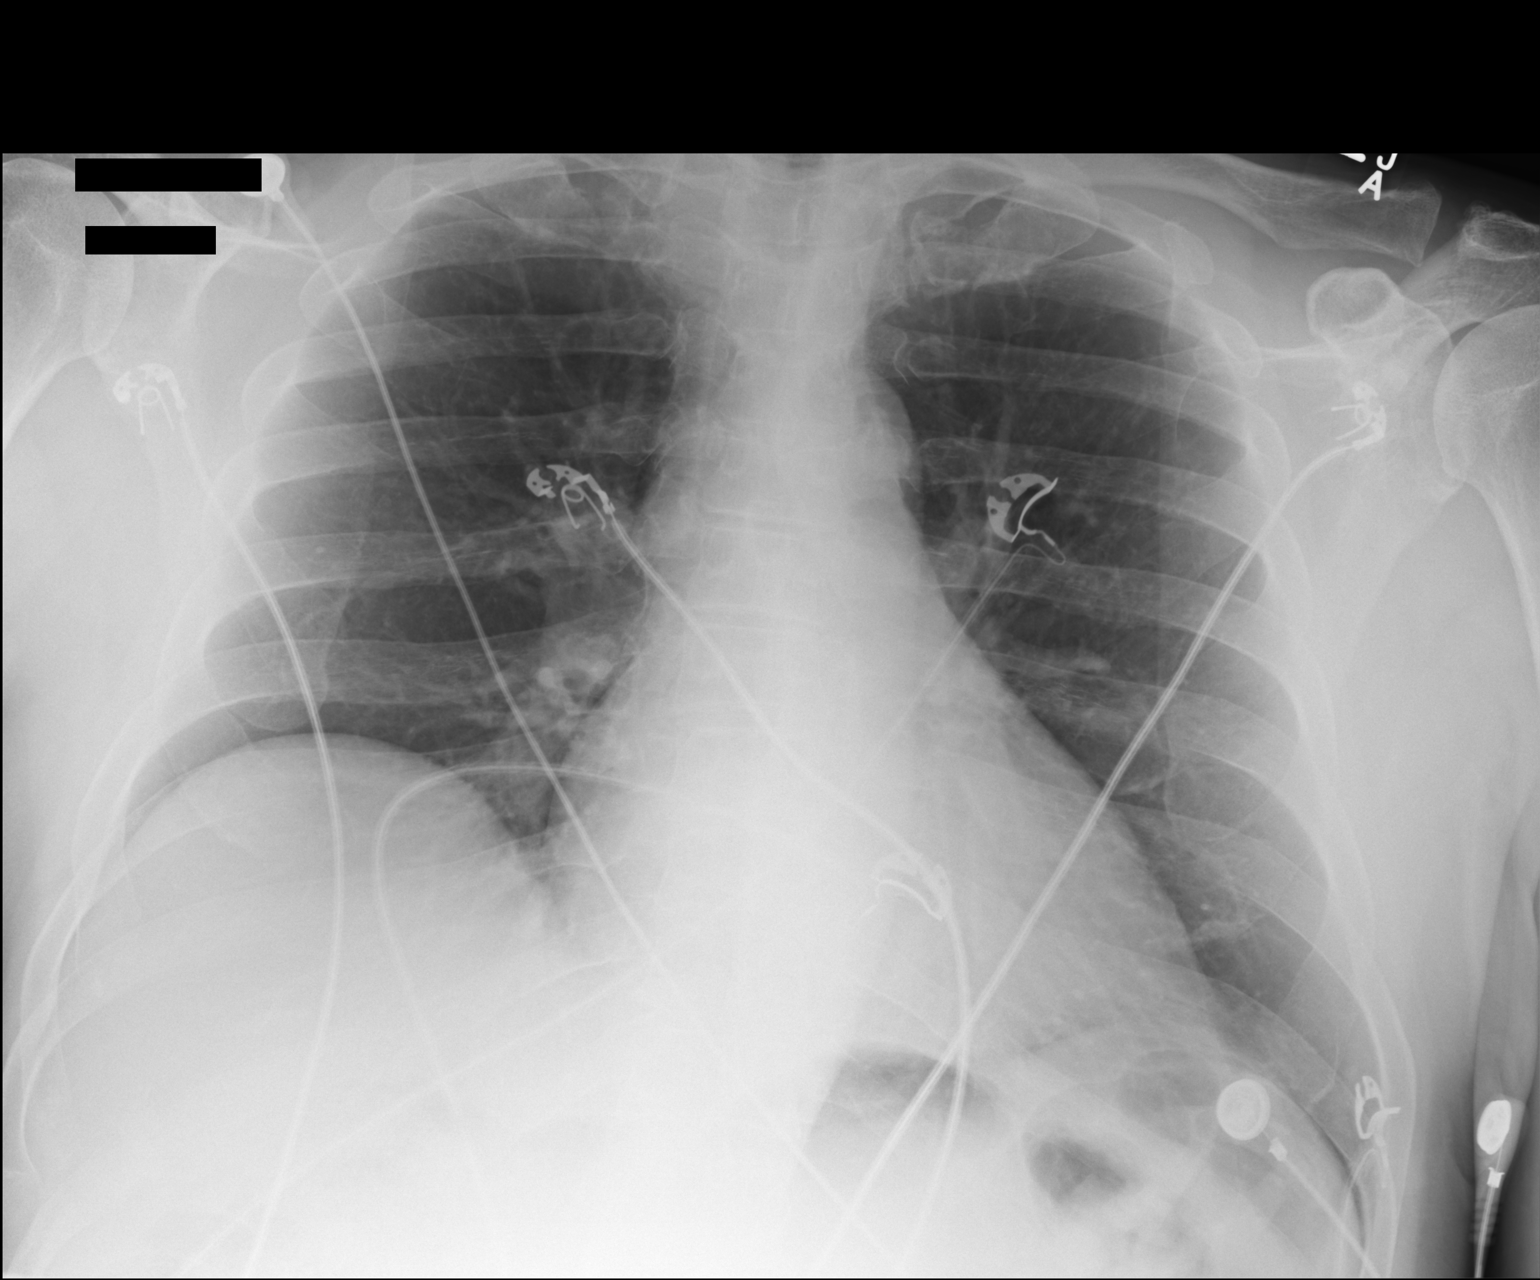

[1 of 1 positions shown; findings below may reference images not displayed]

FINDINGS: Normal heart size and mediastinal contours. No acute infiltrate or
edema. No effusion or pneumothorax. No acute osseous findings.
IMPRESSION: No active disease.

## 2024-04-29 ENCOUNTER — Ambulatory Visit: Admission: EM | Admit: 2024-04-29 | Discharge: 2024-04-29 | Disposition: A | Discharge status: Home / Self Care

## 2024-04-29 ENCOUNTER — Other Ambulatory Visit: Payer: Self-pay

## 2024-04-29 DIAGNOSIS — J3489 Other specified disorders of nose and nasal sinuses: Secondary | ICD-10-CM | POA: Diagnosis not present

## 2024-04-29 MED ORDER — FEXOFENADINE HCL 60 MG PO TABS: 60.0000 mg | ORAL_TABLET | Freq: Two times a day (BID) | ORAL | 0 refills | Status: AC

## 2024-04-29 MED ORDER — FLUTICASONE PROPIONATE 50 MCG/ACT NA SUSP: 1.0000 | Freq: Every day | NASAL | 0 refills | Status: AC

## 2024-04-29 NOTE — Discharge Instructions (Signed)
 I have prescribed a nasal spray and antihistamine to help treat your symptoms. I also recommend sinus rinses and instructions on this have been provided. Please follow up if any symptoms persist or worsen.

## 2024-04-29 NOTE — ED Triage Notes (Signed)
 Pt states he had a bad head cold on Jan 1st and it lasted about 5-6 days. Pt states he now has drainage that is keeping him awake at night. Pt states he can just feel it coming out of his sinuses. Pt states he spit out blood. Pt denies any fevers. Pt states its just the drainage that is bothering him. Pt states its not as bad during the day but it is keeping him awake at night. Pt has not taken any medications at this time for his sx's.

## 2024-04-29 NOTE — ED Provider Notes (Signed)
 Nathan Riley    CSN: [id number] Arrival date & time: 04/29/24  1324      History   Chief Complaint Chief Complaint  Patient presents with   Nasal Congestion    HPI Nathan Riley is a 79 y.o. male.   Patient presents with post nasal drainage. He reports that he had cold like symptoms starting 1/1. Those symptoms have improved but he is left with nasal drainage. Denies cough or fever. Reports that he noticed some blood tinged nasal drainage this morning, so he wanted to be evaluated. He has taken a dose of benadryl and tylenol  with minimal improvement. He reports that these symptoms are interrupting his sleep.      Past Medical History:  Diagnosis Date   Essential hypertension, benign    Hx of echocardiogram    a.  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 Authorizing Provider  citalopram (CELEXA) 10 MG tablet Take 10 mg by mouth daily.   Yes [provider]  fexofenadine  (ALLEGRA ) 60 MG tablet Take 1 tablet (60 mg total) by mouth 2 (two) times daily.  04/29/24  Yes Analis Distler, Darryle E, FNP  fluticasone  (FLONASE ) 50 MCG/ACT nasal spray Place 1 spray into both nostrils daily. 04/29/24  Yes Caria Transue, Darryle E, FNP  apixaban  (ELIQUIS ) 5 MG TABS tablet Take 1 tablet (5 mg total) by mouth 2 (two) times daily. 04/04/14   Allred, Lynwood, MD  Cholecalciferol (PA VITAMIN D-3) 2000 UNITS CAPS Take 2,000 Units by mouth daily.     [provider]  Melatonin 5 MG CAPS Take 1 capsule by mouth 2 (two) times daily as needed (sleep).     [provider]  metoprolol  tartrate (LOPRESSOR ) 25 MG tablet Take 1.5 tablets (37.5 mg total) by mouth 2 (two) times daily. 05/15/14   Lelon Glendia DASEN, PA-C    Family History Family History  Problem Relation Age of Onset   Heart attack Mother    Heart attack Brother 73       CABG   Stroke Neg Hx    Hypertension Mother    Hypertension Brother     Social History Social History[1]   Allergies   Patient has no known allergies.   Review of Systems Review of Systems Per HPI  Physical Exam Triage Vital Signs ED Triage Vitals  Encounter Vitals Group     BP 04/29/24 1340 (!) 156/79     Girls Systolic BP Percentile --      Girls Diastolic BP Percentile --      Boys  Systolic BP Percentile --      Boys Diastolic BP Percentile --      Pulse Rate 04/29/24 1340 71     Resp 04/29/24 1340 18     Temp 04/29/24 1340 98.1 F (36.7 C)     Temp Source 04/29/24 1340 Oral     SpO2 04/29/24 1340 96 %     Weight --      Height --      Head Circumference --      Peak Flow --      Pain Score 04/29/24 1337 0     Pain Loc --      Pain Education --      Exclude from Growth Chart --    No data found.  Updated Vital Signs BP (!) 156/79 (BP Location: Right Arm)   Pulse 71   Temp 98.1 F (36.7 C) (Oral)   Resp 18   SpO2 96%   Visual Acuity Right Eye Distance:   Left Eye Distance:   Bilateral Distance:    Right Eye Near:   Left Eye Near:    Bilateral Near:     Physical Exam Constitutional:      General:  He is not in acute distress.    Appearance: Normal appearance. He is not toxic-appearing or diaphoretic.  HENT:     Head: Normocephalic and atraumatic.     Right Ear: Ear canal normal. A middle ear effusion is present. Tympanic membrane is not perforated, erythematous or bulging.     Left Ear: Ear canal normal. A middle ear effusion is present. Tympanic membrane is not perforated, erythematous or bulging.     Nose: Congestion present.     Right Nostril: No epistaxis.     Left Nostril: No epistaxis.     Mouth/Throat:     Mouth: Mucous membranes are moist.     Pharynx: Posterior oropharyngeal erythema present. No pharyngeal swelling or oropharyngeal exudate.     Tonsils: No tonsillar exudate or tonsillar abscesses.  Eyes:     Extraocular Movements: Extraocular movements intact.     Conjunctiva/sclera: Conjunctivae normal.     Pupils: Pupils are equal, round, and reactive to light.  Cardiovascular:     Rate and Rhythm: Normal rate and regular rhythm.     Pulses: Normal pulses.     Heart sounds: Normal heart sounds.  Pulmonary:     Effort: Pulmonary effort is normal. No respiratory distress.     Breath sounds: Normal breath sounds. No stridor. No wheezing, rhonchi or rales.  Musculoskeletal:        General: Normal range of motion.     Cervical back: Normal range of motion.  Skin:    General: Skin is warm and dry.  Neurological:     General: No focal deficit present.     Mental Status: He is alert and oriented to person, place, and time. Mental status is at baseline.  Psychiatric:        Mood and Affect: Mood normal.        Behavior: Behavior normal.      Riley Treatments / Results  Labs (all labs ordered are listed, but only abnormal results are displayed) Labs Reviewed - No data to display  EKG   Radiology No results found.  Procedures Procedures (including critical care time)  Medications Ordered in Riley Medications - No data to display  Initial Impression /  Assessment and Plan / Riley Course  I have reviewed the triage vital  signs and the nursing notes.  Pertinent labs & imaging results that were available during my care of the patient were reviewed by me and considered in my medical decision making (see chart for details).     Patient presents with chief complaint of post nasal drainage as a result of post viral symptoms. He does not have any cough, oxygen is normal, and there are no abnormal lung sounds on exam so chest imaging is not necessary. There are no signs of bacterial infection on exam that would warrant antibiotic therapy. Will treat with Flonase  and antihistamine. Allegra  prescribed twice daily as this is the safest option for patient's age group. Crcl is 75 so no dosage adjustment necessary. Patient denies that he takes a daily antihistamine so this should be safe. Educated patient on supportive care including sinus rinses and using a humidifier. Advised strict return precautions. Patient verbalized understanding and was agreeable with plan.  Final Clinical Impressions(s) / Riley Diagnoses   Final diagnoses:  Nasal drainage     Discharge Instructions      I have prescribed a nasal spray and antihistamine to help treat your symptoms. I also recommend sinus rinses and instructions on this have been provided. Please follow up if any symptoms persist or worsen.     ED Prescriptions     Medication Sig Dispense Auth. Provider   fluticasone  (FLONASE ) 50 MCG/ACT nasal spray Place 1 spray into both nostrils daily. 16 g Amantha Sklar, Darryle E, FNP   fexofenadine  (ALLEGRA ) 60 MG tablet Take 1 tablet (60 mg total) by mouth 2 (two) times daily. 30 tablet [place], Arthor Gorter E, [place]      PDMP not reviewed this encounter.    [1]  Social History Tobacco Use   Smoking status: Never   Smokeless tobacco: Never  Vaping Use   Vaping status: Never Used  Substance Use Topics   Alcohol use: Yes    Alcohol/week: 1.0 standard drink of alcohol    Types: 1 Cans  of beer per week   Drug use: No     Hazen Darryle BRAVO, [place] 04/29/24 1400  "
